# Patient Record
Sex: Female | Born: 1965
Health system: Southern US, Community
[De-identification: ages and names within clinical notes are randomized; demographics above are authoritative.]

## PROBLEM LIST (undated history)

## (undated) DIAGNOSIS — E559 Vitamin D deficiency, unspecified: Secondary | ICD-10-CM

## (undated) DIAGNOSIS — K449 Diaphragmatic hernia without obstruction or gangrene: Secondary | ICD-10-CM

## (undated) DIAGNOSIS — D649 Anemia, unspecified: Secondary | ICD-10-CM

## (undated) DIAGNOSIS — I10 Essential (primary) hypertension: Secondary | ICD-10-CM

## (undated) DIAGNOSIS — T7840XA Allergy, unspecified, initial encounter: Secondary | ICD-10-CM

## (undated) DIAGNOSIS — M25569 Pain in unspecified knee: Secondary | ICD-10-CM

## (undated) DIAGNOSIS — K219 Gastro-esophageal reflux disease without esophagitis: Secondary | ICD-10-CM

## (undated) DIAGNOSIS — K224 Dyskinesia of esophagus: Secondary | ICD-10-CM

## (undated) HISTORY — DX: Dyskinesia of esophagus: K22.4

## (undated) HISTORY — DX: Anemia, unspecified: D64.9

## (undated) HISTORY — DX: Vitamin D deficiency, unspecified: E55.9

## (undated) HISTORY — DX: Gastro-esophageal reflux disease without esophagitis: K21.9

## (undated) HISTORY — DX: Essential (primary) hypertension: I10

## (undated) HISTORY — DX: Allergy, unspecified, initial encounter: T78.40XA

## (undated) HISTORY — PX: LAPAROSCOPIC GASTRECTOMY: SHX5894

## (undated) HISTORY — DX: Diaphragmatic hernia without obstruction or gangrene: K44.9

## (undated) HISTORY — PX: CHOLECYSTECTOMY: SHX55

---

## 1999-03-08 ENCOUNTER — Ambulatory Visit (HOSPITAL_COMMUNITY): Admission: RE | Admit: 1999-03-08 | Discharge: 1999-03-08 | Payer: Self-pay | Admitting: Internal Medicine

## 1999-03-08 ENCOUNTER — Encounter: Payer: Self-pay | Admitting: Internal Medicine

## 2008-07-07 ENCOUNTER — Emergency Department (HOSPITAL_COMMUNITY): Admission: EM | Admit: 2008-07-07 | Discharge: 2008-07-07 | Payer: Self-pay | Admitting: Family Medicine

## 2008-07-16 ENCOUNTER — Encounter: Admission: RE | Admit: 2008-07-16 | Discharge: 2008-08-31 | Payer: Self-pay | Admitting: Orthopedic Surgery

## 2009-09-01 ENCOUNTER — Ambulatory Visit: Payer: Self-pay | Admitting: Internal Medicine

## 2009-09-01 LAB — CONVERTED CEMR LAB
BUN: 10 mg/dL (ref 6–23)
Basophils Absolute: 0.1 10*3/uL (ref 0.0–0.1)
Basophils Relative: 1 % (ref 0–1)
CO2: 27 meq/L (ref 19–32)
Calcium: 9.1 mg/dL (ref 8.4–10.5)
Chloride: 101 meq/L (ref 96–112)
Creatinine, Ser: 0.59 mg/dL (ref 0.40–1.20)
Eosinophils Absolute: 0.2 10*3/uL (ref 0.0–0.7)
Eosinophils Relative: 4 % (ref 0–5)
Ferritin: 25 ng/mL (ref 10–291)
Glucose, Bld: 86 mg/dL (ref 70–99)
HCT: 42.7 % (ref 36.0–46.0)
Helicobacter Pylori Antibody-IgG: 1 — ABNORMAL HIGH
Hemoglobin: 12.6 g/dL (ref 12.0–15.0)
Iron: 32 ug/dL — ABNORMAL LOW (ref 42–145)
Lymphocytes Relative: 50 % — ABNORMAL HIGH (ref 12–46)
Lymphs Abs: 3.2 10*3/uL (ref 0.7–4.0)
MCHC: 29.5 g/dL — ABNORMAL LOW (ref 30.0–36.0)
MCV: 83.2 fL (ref 78.0–100.0)
Monocytes Absolute: 0.4 10*3/uL (ref 0.1–1.0)
Monocytes Relative: 7 % (ref 3–12)
Neutro Abs: 2.5 10*3/uL (ref 1.7–7.7)
Neutrophils Relative %: 39 % — ABNORMAL LOW (ref 43–77)
Platelets: 361 10*3/uL (ref 150–400)
Potassium: 4.3 meq/L (ref 3.5–5.3)
RBC: 5.13 M/uL — ABNORMAL HIGH (ref 3.87–5.11)
RDW: 15.7 % — ABNORMAL HIGH (ref 11.5–15.5)
Saturation Ratios: 8 % — ABNORMAL LOW (ref 20–55)
Sodium: 137 meq/L (ref 135–145)
TIBC: 399 ug/dL (ref 250–470)
TSH: 2.185 microintl units/mL (ref 0.350–4.500)
UIBC: 367 ug/dL
WBC: 6.4 10*3/uL (ref 4.0–10.5)

## 2009-09-06 ENCOUNTER — Ambulatory Visit (HOSPITAL_COMMUNITY): Admission: RE | Admit: 2009-09-06 | Discharge: 2009-09-06 | Payer: Self-pay | Admitting: Family Medicine

## 2010-09-22 ENCOUNTER — Ambulatory Visit (INDEPENDENT_AMBULATORY_CARE_PROVIDER_SITE_OTHER): Payer: BC Managed Care – PPO | Admitting: Internal Medicine

## 2010-09-22 ENCOUNTER — Encounter: Payer: Self-pay | Admitting: Internal Medicine

## 2010-09-22 ENCOUNTER — Other Ambulatory Visit (INDEPENDENT_AMBULATORY_CARE_PROVIDER_SITE_OTHER): Payer: BC Managed Care – PPO

## 2010-09-22 VITALS — BP 138/82 | HR 93 | Temp 98.5°F | Resp 16 | Ht 67.0 in | Wt 304.0 lb

## 2010-09-22 DIAGNOSIS — K219 Gastro-esophageal reflux disease without esophagitis: Secondary | ICD-10-CM | POA: Insufficient documentation

## 2010-09-22 DIAGNOSIS — Z1231 Encounter for screening mammogram for malignant neoplasm of breast: Secondary | ICD-10-CM | POA: Insufficient documentation

## 2010-09-22 DIAGNOSIS — Z0000 Encounter for general adult medical examination without abnormal findings: Secondary | ICD-10-CM

## 2010-09-22 DIAGNOSIS — D649 Anemia, unspecified: Secondary | ICD-10-CM

## 2010-09-22 DIAGNOSIS — I10 Essential (primary) hypertension: Secondary | ICD-10-CM | POA: Insufficient documentation

## 2010-09-22 DIAGNOSIS — L819 Disorder of pigmentation, unspecified: Secondary | ICD-10-CM

## 2010-09-22 DIAGNOSIS — L810 Postinflammatory hyperpigmentation: Secondary | ICD-10-CM | POA: Insufficient documentation

## 2010-09-22 LAB — TSH: TSH: 1.32 u[IU]/mL (ref 0.35–5.50)

## 2010-09-22 LAB — FOLATE: Folate: 24.8 ng/mL (ref >5.9)

## 2010-09-22 LAB — LIPID PANEL
HDL: 42.4 mg/dL (ref >39.00)
Total CHOL/HDL Ratio: 4
VLDL: 25.2 mg/dL (ref 0.0–40.0)

## 2010-09-22 LAB — COMPREHENSIVE METABOLIC PANEL
Albumin: 3.9 g/dL (ref 3.5–5.2)
BUN: 9 mg/dL (ref 6–23)
Calcium: 9.1 mg/dL (ref 8.4–10.5)
Chloride: 101 mEq/L (ref 96–112)
Glucose, Bld: 83 mg/dL (ref 70–99)
Potassium: 4.1 mEq/L (ref 3.5–5.1)

## 2010-09-22 LAB — CBC WITH DIFFERENTIAL/PLATELET
Eosinophils Relative: 4.5 % (ref 0.0–5.0)
Lymphocytes Relative: 38.1 % (ref 12.0–46.0)
Monocytes Relative: 8.6 % (ref 3.0–12.0)
Neutrophils Relative %: 46.8 % (ref 43.0–77.0)
Platelets: 334 10*3/uL (ref 150.0–400.0)
WBC: 7.4 10*3/uL (ref 4.5–10.5)

## 2010-09-22 MED ORDER — LISINOPRIL 20 MG PO TABS: 20.0000 mg | ORAL_TABLET | Freq: Every day | ORAL | Status: DC

## 2010-09-22 MED ORDER — IRON 66 MG PO TABS: 1.0000 | ORAL_TABLET | Freq: Every day | ORAL | Status: DC

## 2010-09-22 MED ORDER — OMEPRAZOLE 20 MG PO CPDR: 20.0000 mg | DELAYED_RELEASE_CAPSULE | Freq: Every day | ORAL | Status: DC

## 2010-09-22 NOTE — Progress Notes (Signed)
Subjective:    Patient ID: Valerie Sweeney, female    DOB: July 13, 1965, 45 y.o.   MRN: [id number]  Anemia Presents for follow-up visit. There has been no abdominal pain, anorexia, bruising/bleeding easily, confusion, fever, leg swelling, light-headedness, malaise/fatigue, pallor, palpitations, paresthesias, pica or weight loss. Signs of blood loss that are not present include hematemesis, hematochezia, melena, menorrhagia and vaginal bleeding. There are no compliance problems.  Compliance with medications is 76-100%.  Hypertension This is a chronic problem. The current episode started more than 1 year ago. The problem has been gradually improving since onset. The problem is controlled. Pertinent negatives include no anxiety, blurred vision, chest pain, headaches, malaise/fatigue, neck pain, orthopnea, palpitations, peripheral edema, PND, shortness of breath or sweats. There are no associated agents to hypertension. Past treatments include ACE inhibitors. The current treatment provides moderate improvement. Compliance problems include exercise and diet.   Gastrophageal Reflux She complains of heartburn. She reports no abdominal pain, no belching, no chest pain, no choking, no coughing, no dysphagia, no early satiety, no globus sensation, no nausea, no sore throat, no stridor, no tooth decay, no water brash or no wheezing. This is a chronic problem. The current episode started more than 1 year ago. The problem occurs rarely. The problem has been gradually improving. The heartburn duration is less than a minute. The heartburn is located in the substernum. The heartburn is of mild intensity. The heartburn does not wake her from sleep. The heartburn does not limit her activity. The heartburn doesn't change with position. Associated symptoms include anemia. Pertinent negatives include no fatigue, melena, muscle weakness, orthopnea or weight loss. Risk factors include no known risk factors. She has tried a PPI for  the symptoms. The treatment provided significant relief.      Review of Systems  Constitutional: Negative for fever, chills, weight loss, malaise/fatigue, diaphoresis, activity change, appetite change, fatigue and unexpected weight change.  HENT: Negative for sore throat, facial swelling, trouble swallowing, neck pain and voice change.   Eyes: Negative for blurred vision, photophobia, pain, discharge, redness, itching and visual disturbance.  Respiratory: Negative for apnea, cough, choking, chest tightness, shortness of breath, wheezing and stridor.   Cardiovascular: Negative for chest pain, palpitations, orthopnea, leg swelling and PND.  Gastrointestinal: Positive for heartburn. Negative for dysphagia, nausea, vomiting, abdominal pain, diarrhea, constipation, blood in stool, melena, hematochezia, abdominal distention, anal bleeding, anorexia and hematemesis.  Genitourinary: Negative for dysuria, urgency, frequency, hematuria, flank pain, decreased urine volume, vaginal bleeding, vaginal discharge, enuresis, difficulty urinating, genital sores, vaginal pain, menstrual problem, pelvic pain, dyspareunia and menorrhagia.  Musculoskeletal: Negative for myalgias, back pain, joint swelling, arthralgias, gait problem and muscle weakness.  Skin: Positive for rash (she has had "dark spots" on both ankles for 2 years). Negative for color change, pallor and wound.  Neurological: Negative for dizziness, tremors, seizures, syncope, facial asymmetry, speech difficulty, weakness, light-headedness, numbness, headaches and paresthesias.  Hematological: Negative for adenopathy. Does not bruise/bleed easily.  Psychiatric/Behavioral: Negative.  Negative for confusion.       Objective:   Physical Exam  Vitals reviewed. Constitutional: She is oriented to person, place, and time. She appears well-developed and well-nourished. No distress.  HENT:  Mouth/Throat: Oropharynx is clear and moist. No oropharyngeal  exudate.  Eyes: Conjunctivae and EOM are normal. Pupils are equal, round, and reactive to light. Right eye exhibits no discharge. Left eye exhibits no discharge. No scleral icterus.  Neck: Normal range of motion. Neck supple. No JVD present. No tracheal deviation present.  No thyromegaly present.  Cardiovascular: Normal rate, regular rhythm, normal heart sounds and intact distal pulses.  Exam reveals no gallop and no friction rub.   No murmur heard. Pulmonary/Chest: Effort normal and breath sounds normal. No stridor. No respiratory distress. She has no wheezes. She has no rales. She exhibits no tenderness.  Abdominal: Soft. Bowel sounds are normal. She exhibits no distension and no mass. There is no tenderness. There is no rebound and no guarding.  Musculoskeletal: Normal range of motion. She exhibits no edema and no tenderness.  Lymphadenopathy:    She has no cervical adenopathy.  Neurological: She is alert and oriented to person, place, and time. She has normal reflexes. She displays normal reflexes. No cranial nerve deficit. She exhibits normal muscle tone. Coordination normal.  Skin: Skin is warm and dry. No abrasion, no bruising, no burn, no ecchymosis, no laceration, no lesion, no petechiae, no purpura and no rash noted. Rash is not macular, not papular, not maculopapular, not nodular, not pustular, not vesicular and not urticarial. She is not diaphoretic. No cyanosis or erythema. No pallor. Nails show no clubbing.          She has benign-appearing diffuse symmetrical hyperpigmentation over both ankles and feet with no specific skin lesions          Assessment & Plan:

## 2010-09-22 NOTE — Assessment & Plan Note (Signed)
Exam done, labs ordered, pt ed material was given 

## 2010-09-22 NOTE — Assessment & Plan Note (Signed)
mammo ordered.

## 2010-09-22 NOTE — Patient Instructions (Signed)

## 2010-09-22 NOTE — Assessment & Plan Note (Signed)
I advised her that this is a benign skin condition with no specific treatment but she is very upset about the cosmetic appearance and insists that "something be done about it" so I will refer her to dermatology to see if there is an option for a bleaching agent or some other form of treatment

## 2010-09-22 NOTE — Assessment & Plan Note (Signed)
Continue the ACEI, I will check her renal function and lytes today

## 2010-09-22 NOTE — Assessment & Plan Note (Signed)
She is doing well on omeprazole 

## 2010-09-22 NOTE — Assessment & Plan Note (Signed)
She tells me that this is iron deficiency, I have written an Rx for iron and today will check her CBC and vitamin levels

## 2010-09-27 ENCOUNTER — Telehealth: Payer: Self-pay

## 2010-09-27 NOTE — Telephone Encounter (Signed)
Omeprazole covered 09/06/10-09/27/11. Pharmacy notified via fax

## 2010-11-23 ENCOUNTER — Ambulatory Visit (HOSPITAL_COMMUNITY)
Admission: RE | Admit: 2010-11-23 | Discharge: 2010-11-23 | Disposition: A | Discharge status: Home / Self Care | Payer: BC Managed Care – PPO | Source: Ambulatory Visit | Attending: Internal Medicine | Admitting: Internal Medicine

## 2010-11-23 ENCOUNTER — Encounter: Payer: Self-pay | Admitting: Internal Medicine

## 2010-11-23 ENCOUNTER — Ambulatory Visit (INDEPENDENT_AMBULATORY_CARE_PROVIDER_SITE_OTHER): Payer: BC Managed Care – PPO | Admitting: Internal Medicine

## 2010-11-23 VITALS — BP 124/86 | HR 84 | Temp 98.2°F | Resp 16 | Wt 302.0 lb

## 2010-11-23 DIAGNOSIS — M25561 Pain in right knee: Secondary | ICD-10-CM

## 2010-11-23 DIAGNOSIS — IMO0002 Reserved for concepts with insufficient information to code with codable children: Secondary | ICD-10-CM

## 2010-11-23 DIAGNOSIS — M25569 Pain in unspecified knee: Secondary | ICD-10-CM

## 2010-11-23 DIAGNOSIS — M1710 Unilateral primary osteoarthritis, unspecified knee: Secondary | ICD-10-CM | POA: Insufficient documentation

## 2010-11-23 DIAGNOSIS — M179 Osteoarthritis of knee, unspecified: Secondary | ICD-10-CM

## 2010-11-23 DIAGNOSIS — M25562 Pain in left knee: Secondary | ICD-10-CM | POA: Insufficient documentation

## 2010-11-23 NOTE — Assessment & Plan Note (Signed)
I will check plain films today to document the presence and severity on the DJD in both knees

## 2010-11-23 NOTE — Assessment & Plan Note (Signed)
I think she should consider having TKR bilateral so I have asked her to see an orthopedist

## 2010-11-23 NOTE — Patient Instructions (Signed)

## 2010-11-23 NOTE — Progress Notes (Signed)
Subjective:    Patient ID: Valerie Sweeney, female    DOB: May 29, 1965, 45 y.o.   MRN: [id number]  Knee Pain  The incident occurred more than 1 week ago. There was no injury mechanism. The pain is present in the left knee and right knee. The quality of the pain is described as aching. The pain is at a severity of 2/10. The pain is mild. The pain has been intermittent since onset. Associated symptoms include muscle weakness (in both legs). Pertinent negatives include no inability to bear weight, loss of motion, loss of sensation, numbness or tingling. She reports no foreign bodies present. The symptoms are aggravated by movement. She has tried acetaminophen for the symptoms. The treatment provided significant relief.      Review of Systems  HENT: Negative.   Eyes: Negative.   Respiratory: Negative.   Cardiovascular: Negative.   Gastrointestinal: Negative.   Genitourinary: Negative.   Musculoskeletal: Positive for arthralgias (both knees). Negative for myalgias, back pain, joint swelling and gait problem.  Skin: Negative for color change, pallor, rash and wound.  Neurological: Positive for weakness. Negative for dizziness, tingling, tremors, seizures, syncope, facial asymmetry, speech difficulty, light-headedness, numbness and headaches.  Hematological: Negative for adenopathy. Does not bruise/bleed easily.  Psychiatric/Behavioral: Negative.        Objective:   Physical Exam  Vitals reviewed. Constitutional: She appears well-developed and well-nourished. No distress.  HENT:  Head: Normocephalic and atraumatic.  Mouth/Throat: Oropharynx is clear and moist. No oropharyngeal exudate.  Eyes: Conjunctivae are normal. Right eye exhibits no discharge. Left eye exhibits no discharge. No scleral icterus.  Neck: Normal range of motion. Neck supple. No JVD present. No tracheal deviation present. No thyromegaly present.  Cardiovascular: Normal rate, regular rhythm, normal heart sounds and intact  distal pulses.  Exam reveals no gallop and no friction rub.   No murmur heard. Pulmonary/Chest: Effort normal and breath sounds normal. No stridor. No respiratory distress. She has no wheezes. She has no rales. She exhibits no tenderness.  Abdominal: Soft. Bowel sounds are normal. She exhibits no distension. There is no tenderness. There is no rebound and no guarding.  Musculoskeletal: Normal range of motion. She exhibits no edema and no tenderness.       She has changes in both knees c/w severe DJD with crepitance but no effusions, warmth, swelling, ttp, laxity. She has very poor muscle tone in both thighs  Lymphadenopathy:    She has no cervical adenopathy.  Neurological: She is alert. She has normal strength. She displays no atrophy, no tremor and normal reflexes. No cranial nerve deficit or sensory deficit. She exhibits normal muscle tone. She displays a negative Romberg sign. She displays no seizure activity. Coordination and gait normal. She displays no Babinski's sign on the right side. She displays no Babinski's sign on the left side.  Reflex Scores:      Tricep reflexes are 0 on the right side and 0 on the left side.      Bicep reflexes are 0 on the right side and 0 on the left side.      Brachioradialis reflexes are 0 on the right side and 0 on the left side.      Patellar reflexes are 0 on the right side and 0 on the left side.      Achilles reflexes are 0 on the right side and 0 on the left side. Skin: Skin is warm and dry. No rash noted. She is not diaphoretic. No erythema. No  pallor.  Psychiatric: She has a normal mood and affect. Her behavior is normal. Judgment and thought content normal.          Assessment & Plan:

## 2010-12-29 ENCOUNTER — Ambulatory Visit (INDEPENDENT_AMBULATORY_CARE_PROVIDER_SITE_OTHER): Payer: BC Managed Care – PPO | Admitting: Surgery

## 2010-12-29 ENCOUNTER — Encounter (INDEPENDENT_AMBULATORY_CARE_PROVIDER_SITE_OTHER): Payer: Self-pay | Admitting: Surgery

## 2010-12-29 NOTE — Progress Notes (Signed)
Re:   Valerie Sweeney DOB:   [birth year] MRN:   [id number]  ASSESSMENT AND PLAN: 1.  Morbid obesity.  Per the 1991 NIH Consensus Statement, the patient is a candidate for bariatric surgery.  The patient attended our information session and reviewed the different types of bariatric surgery.    The patient is interested in the laparoscopic adjustable gastric band (though she asked questions about all the procedures).  I discussed with the patient the indications and risks of lap band surgery.  The potential risks of surgery include, but are not limited to, bleeding, infection, DVT and PE, slippage and erosion of the band, open surgery, and death.  The patient understands the importance of compliance and long term follow-up with our group after surgery.  I spent 30+ minutes going over lap band, RYGB, and sleeve, including the pros and cons.  They did express a possibility of returning to the [place] in the future.  We talked about long term follow up and problems in [place] with surgical care.  She was given literature regarding lap band surgery and encouraged to visit their web site, [email or web address], and register.  From here we'll obtain labs (recent labs done by Dr. Yetta Barre), x-rays, nutrition consult, and psych consult.  2.  Hypertension 3.  GERD. 4.  DJD of knees. 5.  History of anemia.  REFERRING PHYSICIAN: Sanda Linger, MD, MD  HISTORY OF PRESENT ILLNESS: Valerie Sweeney is a 45 y.o. (DOB: 1965-05-17)  [place] female whose primary care physician is Sanda Linger, MD, MD and comes to me today for consideration of bariatric surgery.  She has been overweight much of her adult life.  She has tried "over a million" diets.  Most specifically, she tried a diet from her home country ([place]), and she lost a lot of weight.  She has tried low calories.  She tried some "pills" in 1999 from a local physician, but this did not work well.  She had a lap chole  In March 1996.  She has GERD.  Was endoscoped  last year, but does not remember physician's name.    No history of liver disease.  No history of pancreas disease.  No history of colon disease.    Past Medical History  Diagnosis Date  . Hypertension   . GERD (gastroesophageal reflux disease)   . Allergy   . Anemia       Past Surgical History  Procedure Date  . Cholecystectomy       Current Outpatient Prescriptions  Medication Sig Dispense Refill  . Iron 66 MG TABS Take 1 tablet (66 mg total) by mouth daily.  30 tablet  11  . levonorgestrel (MIRENA) 20 MCG/24HR IUD 1 each by Intrauterine route once.  1 each  0  . lisinopril (PRINIVIL,ZESTRIL) 20 MG tablet Take 1 tablet (20 mg total) by mouth daily.  30 tablet  11  . omeprazole (PRILOSEC) 20 MG capsule Take 1 capsule (20 mg total) by mouth daily.  30 capsule  11     No Known Allergies  REVIEW OF SYSTEMS: Skin:  No history of rash.  No history of abnormal moles. Infection:  No history of hepatitis or HIV.  No history of MRSA. Neurologic:  No history of stroke.  No history of seizure.  No history of headaches. Cardiac:  Hypertension x 5 years.. No history of heart disease.  No history of prior cardiac catheterization.  No history of seeing a cardiologist. Pulmonary:  Does not smoke cigarettes.  No asthma or bronchitis.  No OSA/CPAP.  Endocrine:  No diabetes. No thyroid disease. Gastrointestinal:  See HPI. Urologic:  No history of kidney stones.  No history of bladder infections. GYN:  Has IUD. Musculoskeletal:  She's see an orthopedist, but can not remember his name.  Dr. Yetta Barre sent her there for consideration of knee replacement.  Her weight became the focus of the visit. Hematologic:  No bleeding disorder.  No history of anemia.  Not anticoagulated. Psycho-social:  The patient is oriented.   The patient has no obvious psychologic or social impairment to understanding our conversation and plan.  SOCIAL and FAMILY HISTORY: Accompanied by her husband. Works at [employer].  Working for PhD in Sales promotion account executive. Has 3 children - twin girls 43 and boy 46. She has lived in this country from about 1990.  Her family went back to the [place] in around 2000, but war broke out and they returned to the [place] in one year.  PHYSICAL EXAM: BP 136/90  Pulse 84  Temp 98.5 F (36.9 C)  Resp 16  Ht 5\' 6"  (1.676 m)  Wt 303 lb (137.44 kg)  BMI 48.91 kg/m2  General: WN F who is alert and generally healthy appearing.  HEENT: Normal. Pupils equal. Good dentition. Neck: Supple. No mass.  No thyroid mass.  Carotid pulse okay with no bruit. Lymph Nodes:  No supraclavicular or cervical nodes. Lungs: Clear to auscultation and symmetric breath sounds. Heart:  RRR. No murmur or rub. Breast:  Pendulous, no mass. Abdomen: Soft. No mass. No tenderness. No hernia. Normal bowel sounds.  No abdominal scars.  More pear shape than apple. Rectal: Not done. Extremities:  Good strength and ROM  in upper and lower extremities.  Varicose veins of both legs. Some pigmentation of both ankles. Neurologic:  Grossly intact to motor and sensory function. Psychiatric: Has normal mood and affect. Behavior is normal.   DATA REVIEWED: Notes  Ovidio Kin, MD,  [facility], [place] [address]  [address]   [place], [place]    [address] Phone:  [phone] FAX:  [phone]

## 2010-12-30 ENCOUNTER — Encounter: Payer: Self-pay | Admitting: Internal Medicine

## 2011-01-06 ENCOUNTER — Ambulatory Visit: Payer: BC Managed Care – PPO | Admitting: Internal Medicine

## 2011-01-24 ENCOUNTER — Ambulatory Visit (HOSPITAL_COMMUNITY)
Admission: RE | Admit: 2011-01-24 | Discharge: 2011-01-24 | Disposition: A | Discharge status: Home / Self Care | Payer: BC Managed Care – PPO | Source: Ambulatory Visit | Attending: Surgery | Admitting: Surgery

## 2011-01-24 ENCOUNTER — Encounter: Payer: Self-pay | Admitting: *Deleted

## 2011-01-24 ENCOUNTER — Encounter (INDEPENDENT_AMBULATORY_CARE_PROVIDER_SITE_OTHER): Payer: Self-pay | Admitting: General Surgery

## 2011-01-24 ENCOUNTER — Ambulatory Visit (INDEPENDENT_AMBULATORY_CARE_PROVIDER_SITE_OTHER): Payer: BC Managed Care – PPO | Admitting: General Surgery

## 2011-01-24 ENCOUNTER — Encounter: Payer: BC Managed Care – PPO | Attending: Surgery | Admitting: *Deleted

## 2011-01-24 ENCOUNTER — Encounter (HOSPITAL_COMMUNITY)
Admission: RE | Disposition: A | Discharge status: Home / Self Care | Payer: Self-pay | Source: Ambulatory Visit | Attending: Surgery

## 2011-01-24 VITALS — BP 122/80 | HR 68 | Temp 97.5°F | Resp 18 | Ht 66.0 in | Wt 297.6 lb

## 2011-01-24 DIAGNOSIS — E669 Obesity, unspecified: Secondary | ICD-10-CM

## 2011-01-24 DIAGNOSIS — K449 Diaphragmatic hernia without obstruction or gangrene: Secondary | ICD-10-CM | POA: Insufficient documentation

## 2011-01-24 DIAGNOSIS — Z01818 Encounter for other preprocedural examination: Secondary | ICD-10-CM | POA: Insufficient documentation

## 2011-01-24 DIAGNOSIS — K224 Dyskinesia of esophagus: Secondary | ICD-10-CM | POA: Insufficient documentation

## 2011-01-24 DIAGNOSIS — Z713 Dietary counseling and surveillance: Secondary | ICD-10-CM | POA: Insufficient documentation

## 2011-01-24 HISTORY — PX: BREATH TEK H PYLORI: SHX5422

## 2011-01-24 SURGERY — BREATH TEST, FOR HELICOBACTER PYLORI

## 2011-01-24 NOTE — Progress Notes (Signed)
Patient ID: [name], female   DOB: [birth year], 46 y.o.   MRN: [id number]  Chief Complaint  Patient presents with  . Other    Discuss gastric sleeve    HPI Valerie Sweeney is a 46 y.o. female. This patient presents for discussion of weight loss surgery. She has a tender information session in his artery gone through a good portion of the bariatric workup and she is argument with my partner to discuss surgery however she is interested in the gastric sleeve. It sounds as though she has done significant at research and has settled at the conclusion. She has trouble with her weight to about 16 years since moving to the [place] from [place] and after giving birth to twins. She has not done much her exercise until about 2 weeks ago she began attending the gym she's been going to gym everyday doing elliptical and the treadmill and has lost about 8 pounds in 2 weeks. She has done several home diets without much success. She does have a history of reflux and takes omeprazole for this however she states that when she goes off the omeprazole she does not have severe symptoms and will go several days without any discomfort.  HPI  Past Medical History  Diagnosis Date  . Hypertension   . GERD (gastroesophageal reflux disease)   . Allergy   . Anemia     Past Surgical History  Procedure Date  . Cholecystectomy     Family History  Problem Relation Age of Onset  . Diabetes Mother   . Stroke Father   . Hypertension Father   . Cancer Neg Hx     Social History History  Substance Use Topics  . Smoking status: Never Smoker   . Smokeless tobacco: Never Used  . Alcohol Use: No    No Known Allergies  Current Outpatient Prescriptions  Medication Sig Dispense Refill  . levonorgestrel (MIRENA) 20 MCG/24HR IUD 1 each by Intrauterine route once.  1 each  0  . triamcinolone cream (KENALOG) 0.1 % as needed.      . calcium citrate-vitamin D 200-200 MG-UNIT TABS Take 1 tablet by mouth daily.         . Iron 66 MG TABS Take 1 tablet (66 mg total) by mouth daily.  30 tablet  11  . lisinopril (PRINIVIL,ZESTRIL) 20 MG tablet Take 1 tablet (20 mg total) by mouth daily.  30 tablet  11  . omeprazole (PRILOSEC) 20 MG capsule Take 1 capsule (20 mg total) by mouth daily.  30 capsule  11  . Prenatal Vit-Fe Fumarate-FA (M-VIT PO) Take by mouth.          Review of Systems Review of Systems All other review of systems negative or noncontributory except as stated in the HPI  Blood pressure 122/80, pulse 68, temperature 97.5 F (36.4 C), temperature source Temporal, resp. rate 18, height 5\' 6"  (1.676 m), weight 297 lb 9.6 oz (134.99 kg).  Physical Exam Physical Exam Physical Exam  Vitals reviewed. Constitutional: He is oriented to person, place, and time. He appears well-developed and well-nourished. No distress.  HENT:  Head: Normocephalic and atraumatic.  Mouth/Throat: No oropharyngeal exudate.  Eyes: Conjunctivae and EOM are normal. Pupils are equal, round, and reactive to light. Right eye exhibits no discharge. Left eye exhibits no discharge. No scleral icterus.  Neck: Normal range of motion. No tracheal deviation present.  Cardiovascular: Normal rate, regular rhythm and normal heart sounds.   Pulmonary/Chest: Effort normal  and breath sounds normal. No stridor. No respiratory distress. He has no wheezes. He has no rales. He exhibits no tenderness.  Abdominal: Soft. Bowel sounds are normal. He exhibits no distension and no mass. There is no tenderness. There is no rebound and no guarding.  Musculoskeletal: Normal range of motion. He exhibits no edema and no tenderness.  Neurological: He is alert and oriented to person, place, and time.  Skin: Skin is warm and dry. No rash noted. He is not diaphoretic. No erythema. No pallor.  Psychiatric: He has a normal mood and affect. His behavior is normal. Judgment and thought content normal.    Data Reviewed   Assessment    Morbid obesity with  hypertension and GERD I discussed with her each procedure of the LAP-BAND, the sleeve gastrectomy, and the gastric bypass and she is interested in the sleeve gastrectomy. I think that she be a fine candidate for any procedures however given her reflux I did warn her that her reflux could be exacerbated with a sleeve gastrectomy even to the point beyond repair with medications. I also explained that if her reflux is too severe at the medications did not control it, her surgical options would be limited to gastric bypass. She expressed understanding and in the meantime, is going to stop her PPI and see how that her reflux is. If she has symptoms more than once in the most twice a week, then I really recommended to her that we consider the LAP-BAND for the gastric bypass. Her concerns about the LAP-BAND are very good.  She is concerned that since she travels frequently to [place] where she returns to [place], maintenance of her lab would not be possible.  She is willing to undergo gastric bypass but she is more interested in the sleeve. We discussed the risks of the procedure including stricture, worsening reflux, leaks, need for reoperative surgery and conversion to gastric bypass, vitamin deficiencies and need for protein and vitamin supplementation, and too little or too much weight loss.    Plan    We will continue with her bariatric surgery workup and plan for sleeve gastrectomy if her reflux is only mild.       Lodema Pilot DAVID 01/24/2011, 5:48 PM

## 2011-01-24 NOTE — Progress Notes (Signed)
  Pre-Op Assessment Visit: Pre-Operative LAGB Surgery  Medical Nutrition Therapy:  Appt start time: 1100 end time:  1200.  Patient was seen on 01/24/2011 for Pre-Operative LAGB Nutrition Assessment. Assessment and letter of approval faxed to [facility] B[facility]ric [facility] Program coordinator on 01/24/2011.  Approval letter sent to [facility] center and will be available in the chart under the media tab.  Handouts given during visit include:  Pre-Op Goals Handout  Patient to call for Pre-Op and Post-Op Nutrition Education at the [facility] when surgery is scheduled.

## 2011-01-24 NOTE — Patient Instructions (Signed)
   Follow Pre-Op Nutrition Goals to prepare for LAGB Surgery.   Call the [facility] at [phone] once you have been given your surgery date to enrolled in the Pre-Op Nutrition Class. You will need to attend this nutrition class 3-4 weeks prior to your surgery. 

## 2011-01-25 ENCOUNTER — Encounter (HOSPITAL_COMMUNITY): Payer: Self-pay

## 2011-01-25 ENCOUNTER — Encounter (HOSPITAL_COMMUNITY): Payer: Self-pay | Admitting: Surgery

## 2011-02-02 ENCOUNTER — Ambulatory Visit (HOSPITAL_COMMUNITY)
Admission: RE | Admit: 2011-02-02 | Discharge: 2011-02-02 | Disposition: A | Discharge status: Home / Self Care | Payer: BC Managed Care – PPO | Source: Ambulatory Visit | Attending: Surgery | Admitting: Surgery

## 2011-02-02 ENCOUNTER — Other Ambulatory Visit (HOSPITAL_COMMUNITY): Payer: BC Managed Care – PPO

## 2011-02-02 DIAGNOSIS — Z1231 Encounter for screening mammogram for malignant neoplasm of breast: Secondary | ICD-10-CM | POA: Insufficient documentation

## 2011-02-02 IMAGING — MG MM DIGITAL SCREENING BILAT
7 series · 7 of 7 positions shown · non-contrast
Comparison: Prior studies.

DG SCREEN MAMMOGRAM BILATERAL
Bilateral CC and MLO view(s) were taken.
Technologist: JACOBIE, RT RM

DIGITAL SCREENING MAMMOGRAM WITH CAD:

[R CC]
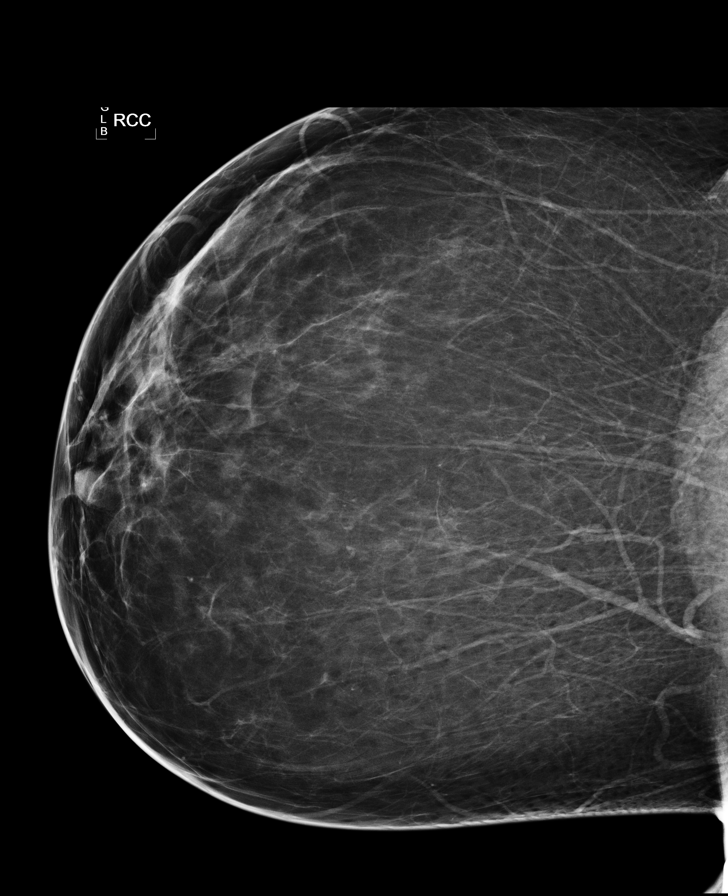

[R MLO (1 of 2)]
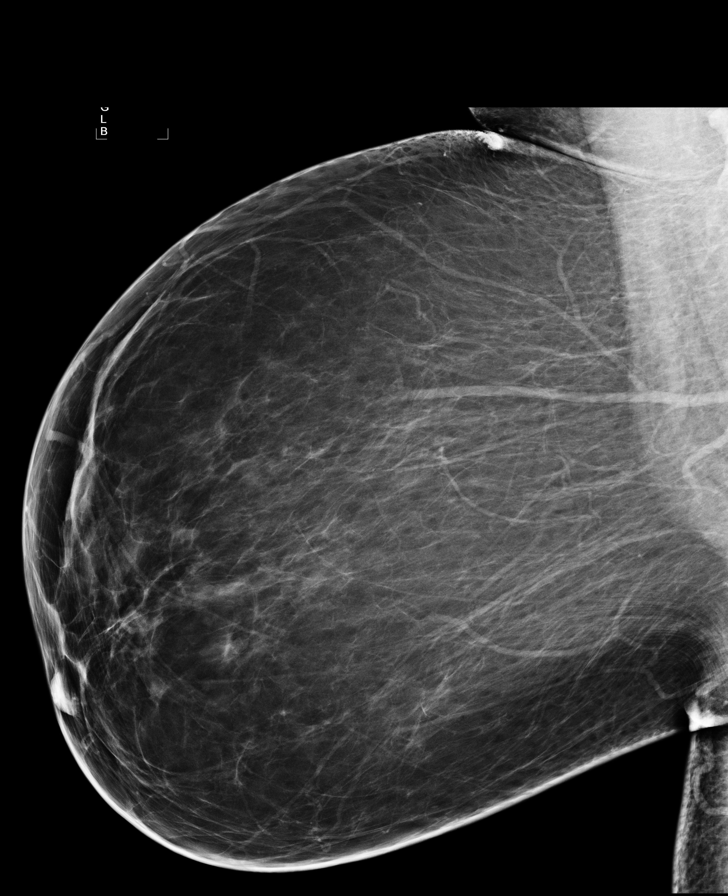

[L CC (1 of 2)]
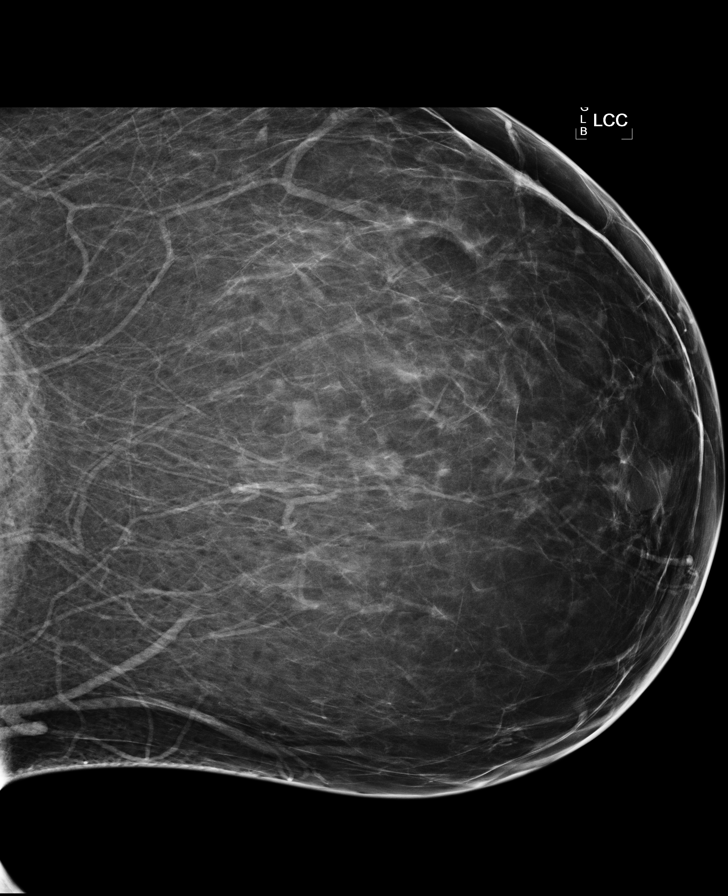

[L MLO (1 of 2)]
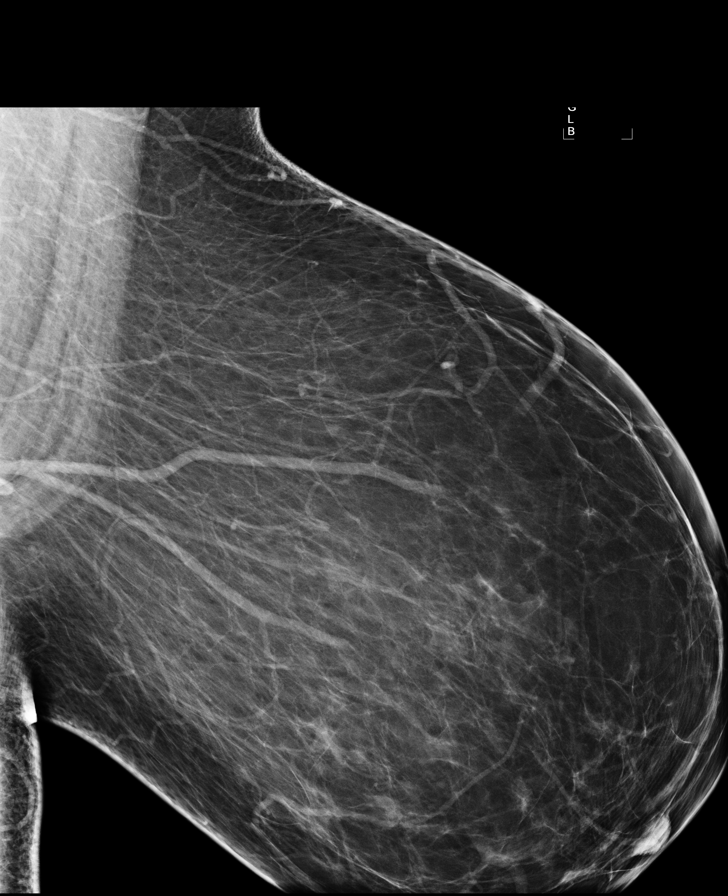

[L CC (2 of 2)]
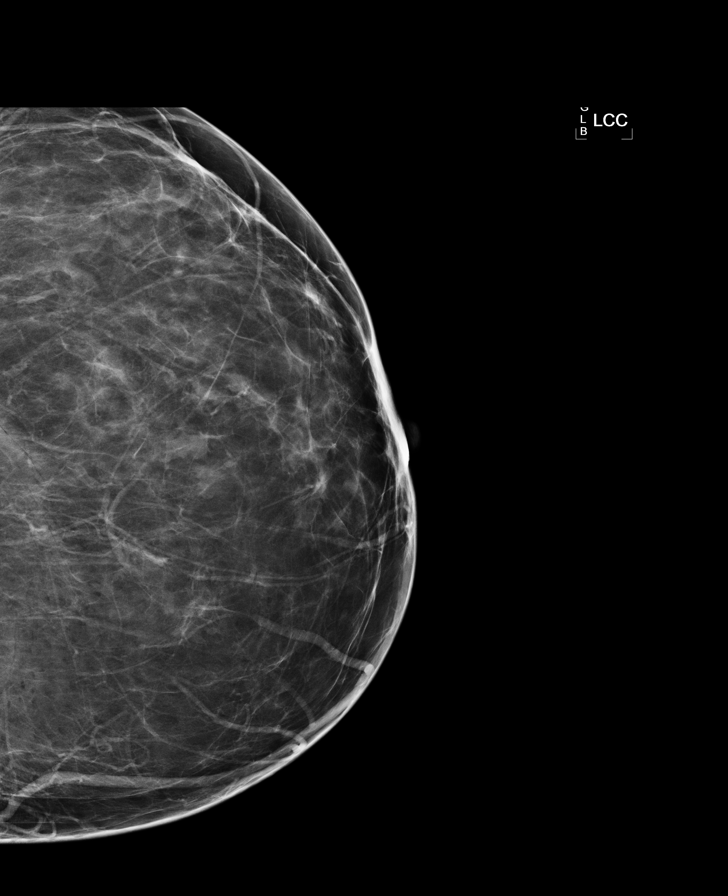

[L MLO (2 of 2)]
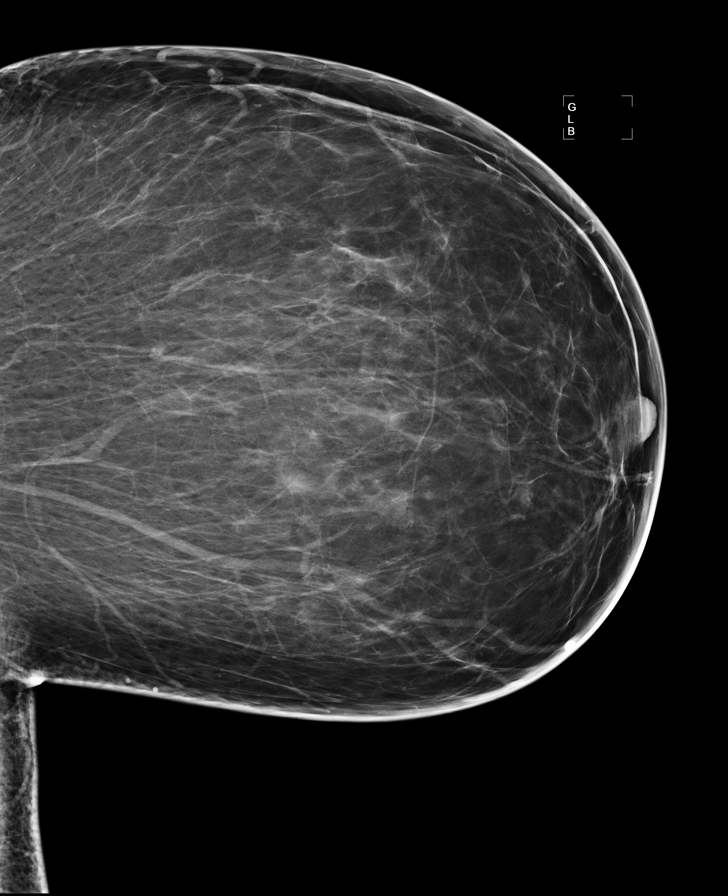

[R MLO (2 of 2)]
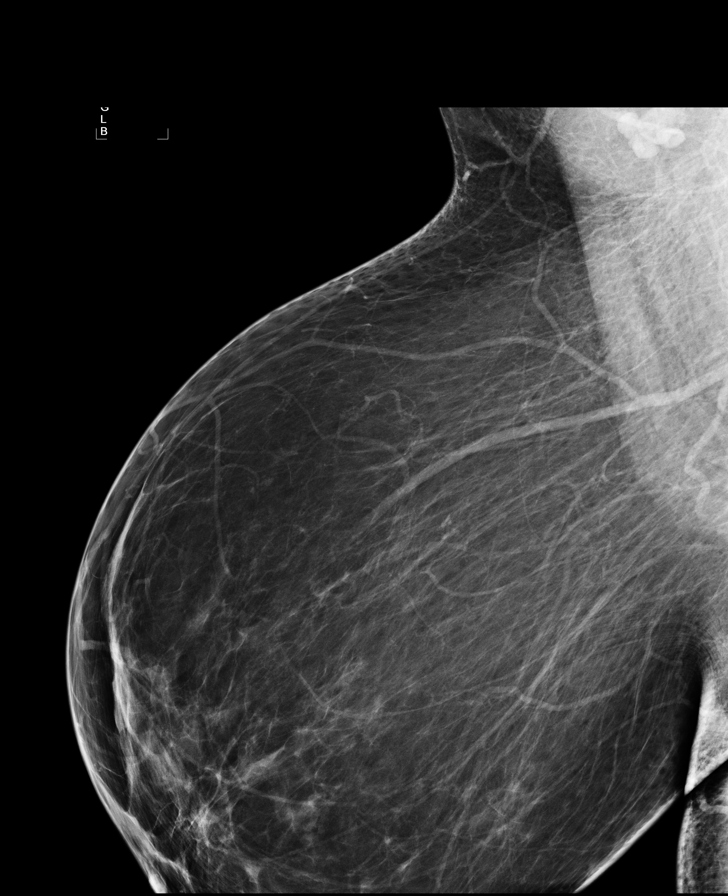

[7 of 7 positions shown; findings below may reference images not displayed]

There are scattered fibroglandular densities.  There is no dominant mass, architectural distortion 
or calcification to suggest malignancy.

Images were processed with CAD.
IMPRESSION: No mammographic evidence of malignancy.  Suggest yearly screening mammography.

A result letter of this screening mammogram will be mailed directly to the patient.

ASSESSMENT: Negative - BI-RADS 1

Screening mammogram in 1 year.
,

## 2011-02-07 ENCOUNTER — Telehealth (INDEPENDENT_AMBULATORY_CARE_PROVIDER_SITE_OTHER): Payer: Self-pay | Admitting: General Surgery

## 2011-02-07 NOTE — Telephone Encounter (Signed)
Received the following email from the patient and advised her I will forward to Dr. Biagio Quint:  From: [name] [[email or web address]] Sent: Monday, February 06, 2011 3:17 PM To: Leandrew Koyanagi Subject: RE: Appointment List Valerie Sweeney,  Please let Dr. Biagio Quint know that I stopped my Omprazole medicine since Tuesday 1/15 and have no issues. I only used Tums twice after eating hot sauce.  He asked me to let him know about this. Many thanks.  Valerie Sweeney

## 2011-02-10 ENCOUNTER — Other Ambulatory Visit (INDEPENDENT_AMBULATORY_CARE_PROVIDER_SITE_OTHER): Payer: Self-pay | Admitting: General Surgery

## 2011-02-10 DIAGNOSIS — E669 Obesity, unspecified: Secondary | ICD-10-CM

## 2011-03-02 ENCOUNTER — Encounter: Payer: BC Managed Care – PPO | Attending: Surgery | Admitting: *Deleted

## 2011-03-02 DIAGNOSIS — Z713 Dietary counseling and surveillance: Secondary | ICD-10-CM | POA: Insufficient documentation

## 2011-03-02 DIAGNOSIS — Z01818 Encounter for other preprocedural examination: Secondary | ICD-10-CM | POA: Insufficient documentation

## 2011-03-02 NOTE — Progress Notes (Signed)
  Bariatric Class:  Appt start time: 0900 end time:  1000.  Pre-Operative Nutrition Class  Patient was seen on 03/02/2011 for Pre-Operative Bariatric Surgery Education at the [facility].  Surgery date: 03/21/11 Surgery type: Gastric Sleeve  Last Weight: 295.4 lbs  Samples given per MNT protocol: Bariatric Advantage Multivitamin Lot #  [id number] Exp: 09/13  Bariatric Advantage Calcium Citrate Lot #  [id number] Exp:  12/13  Celebrate Vitamins Multivitamin Lot # [id number] Exp: 06/14  Celebrate Vitamins Calcium Citrate Lot # [id number] Exp:  11/13  The following the learning objective met by the patient during this course:   Identifies Pre-Op Dietary Goals and will begin 2 weeks pre-operatively   Identifies appropriate sources of fluids and proteins   States protein recommendations and appropriate sources pre and post-operatively  Identifies Post-Operative Dietary Goals and will follow for 2 weeks post-operatively  Identifies appropriate multivitamin and calcium sources  Describes the need for physical activity post-operatively and will follow MD recommendations  States when to call healthcare provider regarding medication questions or post-operative complications  Handouts given during class include:  Pre-Op Bariatric Surgery Diet Handout  Protein Shake Handout  Post-Op Bariatric Surgery Nutrition Handout  BELT Program Information Flyer  Support Group Information Flyer  Follow-Up Plan: Patient will follow-up at [facility] 2 weeks post operatively for diet advancement per MD.

## 2011-03-02 NOTE — Patient Instructions (Signed)
Follow:   Pre-Op Diet per MD 2 weeks prior to surgery  Phase 2- Liquids (clear/full) 2 weeks after surgery  Vitamin/Mineral/Calcium guidelines for purchasing bariatric supplements  Exercise guidelines pre and post-op per MD  Follow-up at [facility] in 2 weeks post-op for diet advancement. Contact Emersynn Deatley as needed with questions/concerns. 

## 2011-03-02 NOTE — Pre-Procedure Instructions (Signed)
SPOKE WITH PT BY PHONE TO ARRANGE HER PREOP APPT AT [facility]--SHE IS FROM [place]--SPEAKS ENGLISH AND TEACHES INTERPRETERS AT [employer]--DOES NOT NEED INTERPRETER FOR HER PREOP APPT.

## 2011-03-03 ENCOUNTER — Encounter: Payer: Self-pay | Admitting: *Deleted

## 2011-03-10 ENCOUNTER — Ambulatory Visit (INDEPENDENT_AMBULATORY_CARE_PROVIDER_SITE_OTHER): Payer: BC Managed Care – PPO | Admitting: General Surgery

## 2011-03-13 ENCOUNTER — Encounter (HOSPITAL_COMMUNITY): Payer: Self-pay | Admitting: Pharmacy Technician

## 2011-03-14 ENCOUNTER — Ambulatory Visit (INDEPENDENT_AMBULATORY_CARE_PROVIDER_SITE_OTHER): Payer: BC Managed Care – PPO | Admitting: General Surgery

## 2011-03-14 ENCOUNTER — Encounter (INDEPENDENT_AMBULATORY_CARE_PROVIDER_SITE_OTHER): Payer: Self-pay | Admitting: General Surgery

## 2011-03-14 NOTE — Progress Notes (Signed)
Patient ID: Valerie Sweeney, female   DOB: [birth year], 46 y.o.   MRN: [id number]  Chief Complaint  Patient presents with  . Bariatric Pre-op    gastric sleeve    HPI Valerie Sweeney is a 46 y.o. female. This patient presents today for preoperative visit for laparoscopic vertical sleeve gastrectomy for obesity. She has a BMI of 47 with hypertension, arthritis, and GERD. She has completed her psychology and nutrition evaluations and laboratory studies and has been cleared for surgery. She had an upper GI which demonstrated a small hiatal hernia.  She has lost 10lbs on her preop diet. HPI  Past Medical History  Diagnosis Date  . Hypertension   . GERD (gastroesophageal reflux disease)   . Allergy   . Anemia     Past Surgical History  Procedure Date  . Cholecystectomy   . Breath tek h pylori 01/24/2011    Procedure: BREATH TEK H PYLORI;  Surgeon: Kandis Cocking, MD;  Location: [facility];  Service: General;  Laterality: N/A;    Family History  Problem Relation Age of Onset  . Diabetes Mother   . Stroke Father   . Hypertension Father   . Cancer Neg Hx     Social History History  Substance Use Topics  . Smoking status: Never Smoker   . Smokeless tobacco: Never Used  . Alcohol Use: No    Allergies  Allergen Reactions  . Pork-Derived Products Other (See Comments)    Belief     Current Outpatient Prescriptions  Medication Sig Dispense Refill  . calcium citrate-vitamin D 200-200 MG-UNIT TABS Take 1 tablet by mouth daily.        . Iron 66 MG TABS Take 1 tablet (66 mg total) by mouth daily.  30 tablet  11  . lisinopril (PRINIVIL,ZESTRIL) 20 MG tablet Take 20 mg by mouth at bedtime.      . Multiple Vitamin (MULITIVITAMIN WITH MINERALS) TABS Take 1 tablet by mouth daily.      Marland Kitchen triamcinolone cream (KENALOG) 0.1 % Apply 1 application topically as needed. Itching       . levonorgestrel (MIRENA) 20 MCG/24HR IUD 1 each by Intrauterine route once.  1 each  0    Review of  Systems Review of Systems All other review of systems negative or noncontributory except as stated in the HPI  Blood pressure 132/90, pulse 84, temperature 97.9 F (36.6 C), temperature source Temporal, resp. rate 18, height 5\' 6"  (1.676 m), weight 291 lb 12.8 oz (132.36 kg).  Physical Exam Physical Exam Physical Exam  Nursing note and vitals reviewed. Constitutional: She is oriented to person, place, and time. She appears well-developed and well-nourished. No distress.  HENT:  Head: Normocephalic and atraumatic.  Mouth/Throat: No oropharyngeal exudate.  Eyes: Conjunctivae and EOM are normal. Pupils are equal, round, and reactive to light. Right eye exhibits no discharge. Left eye exhibits no discharge. No scleral icterus.  Neck: Normal range of motion. Neck supple. No tracheal deviation present.  Cardiovascular: Normal rate, regular rhythm, normal heart sounds and intact distal pulses.   Pulmonary/Chest: Effort normal and breath sounds normal. No stridor. No respiratory distress. She has no wheezes.  Abdominal: Soft. Bowel sounds are normal. She exhibits no distension and no mass. There is no tenderness. There is no rebound and no guarding.  Musculoskeletal: Normal range of motion. She exhibits no edema and no tenderness.  Neurological: She is alert and oriented to person, place, and time.  Skin: Skin is warm  and dry. No rash noted. She is not diaphoretic. No erythema. No pallor.  Psychiatric: She has a normal mood and affect. Her behavior is normal. Judgment and thought content normal.   Data Reviewed   Assessment    Morbid obesity Think that she is a good candidate for sleeve gastrectomy. I did discuss with her the procedure as well as the potential risks. We discussed the risks of infection, bleeding, pain, scarring, too much or too little weight loss, staple line leak and need for reoperation. We also discussed the risks of stricture and worsening reflux and the possibility of the  need to convert this to rule out gastric bypass she expressed understanding and desires to proceed with vertical sleeve gastrectomy.    Plan    We will plan for vertical sleeve gastrectomy next week.       Lodema Pilot DAVID 03/14/2011, 3:49 PM

## 2011-03-15 ENCOUNTER — Encounter (HOSPITAL_COMMUNITY)
Admission: RE | Admit: 2011-03-15 | Discharge: 2011-03-15 | Disposition: A | Discharge status: Home / Self Care | Payer: BC Managed Care – PPO | Source: Ambulatory Visit | Attending: General Surgery | Admitting: General Surgery

## 2011-03-15 ENCOUNTER — Ambulatory Visit (HOSPITAL_COMMUNITY)
Admission: RE | Admit: 2011-03-15 | Discharge: 2011-03-15 | Disposition: A | Discharge status: Home / Self Care | Payer: BC Managed Care – PPO | Source: Ambulatory Visit | Attending: General Surgery | Admitting: General Surgery

## 2011-03-15 ENCOUNTER — Encounter (HOSPITAL_COMMUNITY): Payer: Self-pay

## 2011-03-15 ENCOUNTER — Other Ambulatory Visit: Payer: Self-pay

## 2011-03-15 DIAGNOSIS — Z01818 Encounter for other preprocedural examination: Secondary | ICD-10-CM | POA: Insufficient documentation

## 2011-03-15 DIAGNOSIS — K219 Gastro-esophageal reflux disease without esophagitis: Secondary | ICD-10-CM | POA: Insufficient documentation

## 2011-03-15 DIAGNOSIS — Z01810 Encounter for preprocedural cardiovascular examination: Secondary | ICD-10-CM | POA: Insufficient documentation

## 2011-03-15 DIAGNOSIS — I10 Essential (primary) hypertension: Secondary | ICD-10-CM | POA: Insufficient documentation

## 2011-03-15 DIAGNOSIS — Z01812 Encounter for preprocedural laboratory examination: Secondary | ICD-10-CM | POA: Insufficient documentation

## 2011-03-15 DIAGNOSIS — E669 Obesity, unspecified: Secondary | ICD-10-CM

## 2011-03-15 HISTORY — DX: Pain in unspecified knee: M25.569

## 2011-03-15 LAB — COMPREHENSIVE METABOLIC PANEL
AST: 19 U/L (ref 0–37)
Albumin: 3.8 g/dL (ref 3.5–5.2)
BUN: 13 mg/dL (ref 6–23)
Calcium: 9.6 mg/dL (ref 8.4–10.5)
Chloride: 98 mEq/L (ref 96–112)
Creatinine, Ser: 0.56 mg/dL (ref 0.50–1.10)
GFR calc non Af Amer: >90 mL/min (ref >90)
Total Bilirubin: 0.2 mg/dL — ABNORMAL LOW (ref 0.3–1.2)

## 2011-03-15 LAB — CBC
HCT: 38.2 % (ref 36.0–46.0)
MCH: 25.7 pg — ABNORMAL LOW (ref 26.0–34.0)
MCV: 78.6 fL (ref 78.0–100.0)
Platelets: 314 10*3/uL (ref 150–400)
RDW: 14.4 % (ref 11.5–15.5)
WBC: 6.9 10*3/uL (ref 4.0–10.5)

## 2011-03-15 LAB — DIFFERENTIAL
Basophils Absolute: 0.1 10*3/uL (ref 0.0–0.1)
Basophils Relative: 1 % (ref 0–1)
Lymphocytes Relative: 39 % (ref 12–46)
Neutro Abs: 3.4 10*3/uL (ref 1.7–7.7)
Neutrophils Relative %: 49 % (ref 43–77)

## 2011-03-15 NOTE — Pre-Procedure Instructions (Signed)
PREOP ANESTHESIOLOGY CONSULT WAS DONE TODAY BY DR. ROSE.  EKG AND CXR WAS DONE TODAY PREOP - AS PER ANESTHESIOLOGIST'S GUIDELINES.  PREOP LABS DRAWN TODAY WERE CBC WITH DIFF AND CMET AND SERUM PREG.

## 2011-03-15 NOTE — Anesthesia Preprocedure Evaluation (Addendum)
Anesthesia Evaluation  Patient identified by MRN, date of birth, ID band Patient awake    Reviewed: Allergy & Precautions, H&P , NPO status , Patient's Chart, lab work & pertinent test results  Airway Mallampati: II TM Distance: <3 FB Neck ROM: Full    Dental No notable dental hx.    Pulmonary neg pulmonary ROS,  breath sounds clear to auscultation  Pulmonary exam normal       Cardiovascular hypertension, Rhythm:Regular Rate:Normal     Neuro/Psych negative neurological ROS  negative psych ROS   GI/Hepatic Neg liver ROS, GERD-  ,  Endo/Other  Morbid obesity  Renal/GU negative Renal ROS  negative genitourinary   Musculoskeletal negative musculoskeletal ROS (+)   Abdominal   Peds negative pediatric ROS (+)  Hematology negative hematology ROS (+)   Anesthesia Other Findings   Reproductive/Obstetrics negative OB ROS                          Anesthesia Physical Anesthesia Plan  ASA: III  Anesthesia Plan: General   Post-op Pain Management:    Induction: Intravenous  Airway Management Planned: Oral ETT  Additional Equipment:   Intra-op Plan:   Post-operative Plan: Extubation in OR  Informed Consent: I have reviewed the patients History and Physical, chart, labs and discussed the procedure including the risks, benefits and alternatives for the proposed anesthesia with the patient or authorized representative who has indicated his/her understanding and acceptance.   Dental advisory given  Plan Discussed with: CRNA  Anesthesia Plan Comments:         Anesthesia Quick Evaluation

## 2011-03-15 NOTE — Patient Instructions (Signed)
YOUR SURGERY IS SCHEDULED ON:  Tuesday 3/12  AT 7:15 AM  REPORT TO [facility] AT:  5:15 AM      PHONE # FOR SHORT STAY IS [phone]  DO NOT EAT OR DRINK ANYTHING AFTER MIDNIGHT THE NIGHT BEFORE YOUR SURGERY.  YOU MAY BRUSH YOUR TEETH, RINSE OUT YOUR MOUTH--BUT NO WATER, NO FOOD, NO CHEWING GUM, NO MINTS, NO CANDIES, NO CHEWING TOBACCO.  PLEASE TAKE THE FOLLOWING MEDICATIONS THE AM OF YOUR SURGERY WITH A FEW SIPS OF WATER:  NO MEDICATIONS TO TAKE THE AM OF SURGERY    IF YOU USE INHALERS--USE YOUR INHALERS THE AM OF YOUR SURGERY AND BRING INHALERS TO THE HOSPITAL -TAKE TO SURGERY.    IF YOU ARE DIABETIC:  DO NOT TAKE ANY DIABETIC MEDICATIONS THE AM OF YOUR SURGERY.  IF YOU TAKE INSULIN IN THE EVENINGS--PLEASE ONLY TAKE 1/2 NORMAL EVENING DOSE THE NIGHT BEFORE YOUR SURGERY.  NO INSULIN THE AM OF YOUR SURGERY.  IF YOU HAVE SLEEP APNEA AND USE CPAP OR BIPAP--PLEASE BRING THE MASK --NOT THE MACHINE-NOT THE TUBING   -JUST THE MASK. DO NOT BRING VALUABLES, MONEY, CREDIT CARDS.  CONTACT LENS, DENTURES / PARTIALS, GLASSES SHOULD NOT BE WORN TO SURGERY AND IN MOST CASES-HEARING AIDS WILL NEED TO BE REMOVED.  BRING YOUR GLASSES CASE, ANY EQUIPMENT NEEDED FOR YOUR CONTACT LENS. FOR PATIENTS ADMITTED TO THE HOSPITAL--CHECK OUT TIME THE DAY OF DISCHARGE IS 11:00 AM.  ALL INPATIENT ROOMS ARE PRIVATE - WITH BATHROOM, TELEPHONE, TELEVISION AND WIFI INTERNET. IF YOU ARE BEING DISCHARGED THE SAME DAY OF YOUR SURGERY--YOU CAN NOT DRIVE YOURSELF HOME--AND SHOULD NOT GO HOME ALONE BY TAXI OR BUS.  NO DRIVING OR OPERATING MACHINERY FOR 24 HOURS FOLLOWING ANESTHESIA / PAIN MEDICATIONS.                            SPECIAL INSTRUCTIONS:  CHLORHEXIDINE SOAP SHOWER (other brand names are Betasept and Hibiclens ) PLEASE SHOWER WITH CHLORHEXIDINE THE NIGHT BEFORE YOUR SURGERY AND THE AM OF YOUR SURGERY. DO NOT USE CHLORHEXIDINE ON YOUR FACE OR PRIVATE AREAS--YOU MAY USE YOUR NORMAL SOAP THOSE AREAS AND  YOUR NORMAL SHAMPOO.  WOMEN SHOULD AVOID SHAVING UNDER ARMS AND SHAVING LEGS 48 HOURS BEFORE USING CHLORHEXIDINE TO AVOID SKIN IRRITATION.  DO NOT USE IF ALLERGIC TO CHLORHEXIDINE.  PLEASE READ OVER ANY  FACT SHEETS THAT YOU WERE GIVEN: MRSA INFORMATION, BLOOD TRANSFUSION INFORMATION, INCENTIVE SPIROMETER INFORMATION.

## 2011-03-17 ENCOUNTER — Ambulatory Visit (INDEPENDENT_AMBULATORY_CARE_PROVIDER_SITE_OTHER): Payer: BC Managed Care – PPO | Admitting: Internal Medicine

## 2011-03-17 ENCOUNTER — Encounter: Payer: Self-pay | Admitting: Internal Medicine

## 2011-03-17 VITALS — BP 130/82 | HR 88 | Temp 98.2°F | Resp 16 | Wt 291.0 lb

## 2011-03-17 DIAGNOSIS — I10 Essential (primary) hypertension: Secondary | ICD-10-CM

## 2011-03-17 DIAGNOSIS — Z23 Encounter for immunization: Secondary | ICD-10-CM

## 2011-03-17 DIAGNOSIS — D649 Anemia, unspecified: Secondary | ICD-10-CM

## 2011-03-17 NOTE — Patient Instructions (Signed)

## 2011-03-17 NOTE — Progress Notes (Signed)
  Subjective:    Patient ID: Valerie Sweeney, female    DOB: 1965-07-18, 46 y.o.   MRN: [id number]  Anemia Presents for follow-up visit. There has been no abdominal pain, anorexia, bruising/bleeding easily, confusion, fever, leg swelling, light-headedness, malaise/fatigue, pallor, palpitations, paresthesias, pica or weight loss. Signs of blood loss that are not present include hematemesis, hematochezia, melena, menorrhagia and vaginal bleeding. There are no compliance problems.       Review of Systems  Constitutional: Negative for fever, chills, weight loss, malaise/fatigue, diaphoresis, activity change, appetite change, fatigue and unexpected weight change.  HENT: Negative.   Eyes: Negative.   Respiratory: Negative for cough, choking, chest tightness, shortness of breath, wheezing and stridor.   Cardiovascular: Negative for chest pain, palpitations and leg swelling.  Gastrointestinal: Negative for nausea, vomiting, abdominal pain, diarrhea, constipation, blood in stool, melena, hematochezia, abdominal distention, anal bleeding, anorexia and hematemesis.  Genitourinary: Negative.  Negative for vaginal bleeding and menorrhagia.  Musculoskeletal: Negative for myalgias, back pain, joint swelling, arthralgias and gait problem.  Skin: Negative for color change, pallor, rash and wound.  Neurological: Negative for dizziness, tremors, seizures, syncope, facial asymmetry, speech difficulty, weakness, light-headedness, numbness, headaches and paresthesias.  Hematological: Negative for adenopathy. Does not bruise/bleed easily.  Psychiatric/Behavioral: Negative.  Negative for confusion.       Objective:   Physical Exam  Vitals reviewed. Constitutional: She is oriented to person, place, and time. She appears well-developed and well-nourished. No distress.  HENT:  Head: Normocephalic and atraumatic.  Mouth/Throat: Oropharynx is clear and moist. No oropharyngeal exudate.  Eyes: Conjunctivae are  normal. Right eye exhibits no discharge. Left eye exhibits no discharge. No scleral icterus.  Neck: Normal range of motion. Neck supple. No JVD present. No tracheal deviation present. No thyromegaly present.  Cardiovascular: Normal rate, regular rhythm, normal heart sounds and intact distal pulses.  Exam reveals no gallop and no friction rub.   No murmur heard. Pulmonary/Chest: Effort normal and breath sounds normal. No stridor. No respiratory distress. She has no wheezes. She has no rales. She exhibits no tenderness.  Abdominal: Soft. Bowel sounds are normal. She exhibits no distension. There is no tenderness. There is no rebound and no guarding.  Musculoskeletal: Normal range of motion. She exhibits no edema and no tenderness.  Lymphadenopathy:    She has no cervical adenopathy.  Neurological: She is oriented to person, place, and time.  Skin: Skin is warm and dry. No rash noted. She is not diaphoretic. No erythema. No pallor.  Psychiatric: She has a normal mood and affect. Her behavior is normal. Judgment and thought content normal.      Lab Results  Component Value Date   WBC 6.9 03/15/2011   HGB 12.5 03/15/2011   HCT 38.2 03/15/2011   PLT 314 03/15/2011   GLUCOSE 80 03/15/2011   CHOL 164 09/22/2010   TRIG 126.0 09/22/2010   HDL 42.40 09/22/2010   LDLCALC 96 09/22/2010   ALT 23 03/15/2011   AST 19 03/15/2011   NA 136 03/15/2011   K 4.1 03/15/2011   CL 98 03/15/2011   CREATININE 0.56 03/15/2011   BUN 13 03/15/2011   CO2 27 03/15/2011   TSH 1.32 09/22/2010      Assessment & Plan:

## 2011-03-20 ENCOUNTER — Encounter: Payer: Self-pay | Admitting: Internal Medicine

## 2011-03-20 NOTE — Assessment & Plan Note (Signed)
This has improved.

## 2011-03-20 NOTE — Assessment & Plan Note (Signed)
Her BP is well controlled 

## 2011-03-21 ENCOUNTER — Encounter (HOSPITAL_COMMUNITY): Payer: Self-pay | Admitting: *Deleted

## 2011-03-21 ENCOUNTER — Ambulatory Visit (HOSPITAL_COMMUNITY): Payer: BC Managed Care – PPO | Admitting: Anesthesiology

## 2011-03-21 ENCOUNTER — Encounter (HOSPITAL_COMMUNITY)
Admission: RE | Disposition: A | Discharge status: Home / Self Care | Payer: Self-pay | Source: Ambulatory Visit | Attending: General Surgery

## 2011-03-21 ENCOUNTER — Inpatient Hospital Stay (HOSPITAL_COMMUNITY)
Admission: RE | Admit: 2011-03-21 | Discharge: 2011-03-23 | DRG: 293 | Disposition: A | Discharge status: Home / Self Care | Payer: BC Managed Care – PPO | Source: Ambulatory Visit | Attending: General Surgery | Admitting: General Surgery

## 2011-03-21 ENCOUNTER — Encounter (HOSPITAL_COMMUNITY): Payer: Self-pay | Admitting: Anesthesiology

## 2011-03-21 DIAGNOSIS — I10 Essential (primary) hypertension: Secondary | ICD-10-CM

## 2011-03-21 DIAGNOSIS — K2100 Gastro-esophageal reflux disease with esophagitis, without bleeding: Secondary | ICD-10-CM

## 2011-03-21 DIAGNOSIS — E669 Obesity, unspecified: Secondary | ICD-10-CM

## 2011-03-21 DIAGNOSIS — K219 Gastro-esophageal reflux disease without esophagitis: Secondary | ICD-10-CM | POA: Diagnosis present

## 2011-03-21 DIAGNOSIS — Z6841 Body Mass Index (BMI) 40.0 and over, adult: Secondary | ICD-10-CM

## 2011-03-21 SURGERY — GASTRECTOMY, SLEEVE, LAPAROSCOPIC
Anesthesia: General | Wound class: Clean Contaminated

## 2011-03-21 MED ORDER — STERILE WATER FOR IRRIGATION IR SOLN
Status: DC | PRN
  Administered 2011-03-21: 1500 mL

## 2011-03-21 MED ORDER — LACTATED RINGERS IR SOLN
Status: DC | PRN
  Administered 2011-03-21: 1

## 2011-03-21 MED ORDER — FIBRIN SEALANT COMPONENT 5 ML EX KIT
PACK | CUTANEOUS | Status: AC
  Filled 2011-03-21: qty 2

## 2011-03-21 MED ORDER — ENOXAPARIN SODIUM 40 MG/0.4ML ~~LOC~~ SOLN
40.0000 mg | SUBCUTANEOUS | Status: DC
  Administered 2011-03-22 – 2011-03-23 (×2): 40 mg via SUBCUTANEOUS
  Filled 2011-03-21 (×2): qty 0.4

## 2011-03-21 MED ORDER — BUPIVACAINE HCL 0.25 % IJ SOLN
INTRAMUSCULAR | Status: DC | PRN
  Administered 2011-03-21: 30 mL

## 2011-03-21 MED ORDER — SUCCINYLCHOLINE CHLORIDE 20 MG/ML IJ SOLN
INTRAMUSCULAR | Status: DC | PRN
  Administered 2011-03-21: 100 mg via INTRAVENOUS

## 2011-03-21 MED ORDER — FIBRIN SEALANT COMPONENT 10 ML EX KIT
PACK | CUTANEOUS | Status: DC | PRN
  Administered 2011-03-21: 10 mL

## 2011-03-21 MED ORDER — CISATRACURIUM BESYLATE 2 MG/ML IV SOLN
INTRAVENOUS | Status: DC | PRN
  Administered 2011-03-21: 4 mg via INTRAVENOUS
  Administered 2011-03-21: 2 mg via INTRAVENOUS
  Administered 2011-03-21: 6 mg via INTRAVENOUS
  Administered 2011-03-21: 4 mg via INTRAVENOUS

## 2011-03-21 MED ORDER — LIDOCAINE-EPINEPHRINE 1 %-1:100000 IJ SOLN
INTRAMUSCULAR | Status: DC | PRN
  Administered 2011-03-21: 30 mL

## 2011-03-21 MED ORDER — LIP MEDEX EX OINT
TOPICAL_OINTMENT | CUTANEOUS | Status: AC
  Administered 2011-03-21: 12:00:00
  Filled 2011-03-21: qty 7

## 2011-03-21 MED ORDER — DEXAMETHASONE SODIUM PHOSPHATE 10 MG/ML IJ SOLN
INTRAMUSCULAR | Status: DC | PRN
  Administered 2011-03-21: 10 mg via INTRAVENOUS

## 2011-03-21 MED ORDER — ENOXAPARIN SODIUM 40 MG/0.4ML ~~LOC~~ SOLN
40.0000 mg | Freq: Once | SUBCUTANEOUS | Status: AC
  Administered 2011-03-21: 40 mg via SUBCUTANEOUS

## 2011-03-21 MED ORDER — PROMETHAZINE HCL 25 MG/ML IJ SOLN: 6.2500 mg | INTRAMUSCULAR | Status: DC | PRN

## 2011-03-21 MED ORDER — OXYCODONE-ACETAMINOPHEN 5-325 MG/5ML PO SOLN
5.0000 mL | ORAL | Status: DC | PRN
  Administered 2011-03-22 – 2011-03-23 (×2): 5 mL via ORAL
  Filled 2011-03-21: qty 5
  Filled 2011-03-21: qty 10

## 2011-03-21 MED ORDER — HYDROMORPHONE HCL PF 1 MG/ML IJ SOLN
0.2500 mg | INTRAMUSCULAR | Status: DC | PRN
  Administered 2011-03-21 (×3): 0.5 mg via INTRAVENOUS

## 2011-03-21 MED ORDER — LACTATED RINGERS IV SOLN: INTRAVENOUS | Status: DC

## 2011-03-21 MED ORDER — ONDANSETRON HCL 4 MG/2ML IJ SOLN
INTRAMUSCULAR | Status: DC | PRN
  Administered 2011-03-21: 4 mg via INTRAVENOUS

## 2011-03-21 MED ORDER — ACETAMINOPHEN 10 MG/ML IV SOLN
INTRAVENOUS | Status: AC
  Filled 2011-03-21: qty 100

## 2011-03-21 MED ORDER — HYDROMORPHONE HCL PF 1 MG/ML IJ SOLN
INTRAMUSCULAR | Status: AC
  Filled 2011-03-21: qty 1

## 2011-03-21 MED ORDER — SODIUM CHLORIDE 0.9 % IV SOLN
1.0000 g | INTRAVENOUS | Status: AC
  Administered 2011-03-21: 1 g via INTRAVENOUS

## 2011-03-21 MED ORDER — ONDANSETRON HCL 4 MG/2ML IJ SOLN
4.0000 mg | INTRAMUSCULAR | Status: DC | PRN
  Administered 2011-03-21 – 2011-03-22 (×5): 4 mg via INTRAVENOUS
  Filled 2011-03-21 (×5): qty 2

## 2011-03-21 MED ORDER — SODIUM CHLORIDE 0.9 % IV SOLN
INTRAVENOUS | Status: AC
  Filled 2011-03-21: qty 1

## 2011-03-21 MED ORDER — LIDOCAINE-EPINEPHRINE 1 %-1:100000 IJ SOLN
INTRAMUSCULAR | Status: AC
  Filled 2011-03-21: qty 1

## 2011-03-21 MED ORDER — ACETAMINOPHEN 160 MG/5ML PO SOLN: 650.0000 mg | ORAL | Status: DC | PRN

## 2011-03-21 MED ORDER — PROPOFOL 10 MG/ML IV BOLUS
INTRAVENOUS | Status: DC | PRN
  Administered 2011-03-21: 200 mg via INTRAVENOUS

## 2011-03-21 MED ORDER — KCL IN DEXTROSE-NACL 20-5-0.45 MEQ/L-%-% IV SOLN
INTRAVENOUS | Status: DC
  Administered 2011-03-21: 125 mL/h via INTRAVENOUS
  Administered 2011-03-21 – 2011-03-22 (×2): via INTRAVENOUS
  Administered 2011-03-22 (×2): 125 mL/h via INTRAVENOUS
  Administered 2011-03-23: 02:00:00 via INTRAVENOUS
  Filled 2011-03-21 (×8): qty 1000

## 2011-03-21 MED ORDER — MORPHINE SULFATE 2 MG/ML IJ SOLN
2.0000 mg | INTRAMUSCULAR | Status: DC | PRN
  Administered 2011-03-21: 2 mg via INTRAVENOUS
  Administered 2011-03-21 (×2): 4 mg via INTRAVENOUS
  Administered 2011-03-21: 2 mg via INTRAVENOUS
  Administered 2011-03-21: 4 mg via INTRAVENOUS
  Administered 2011-03-21: 2 mg via INTRAVENOUS
  Administered 2011-03-21: 4 mg via INTRAVENOUS
  Administered 2011-03-22 (×6): 2 mg via INTRAVENOUS
  Administered 2011-03-22: 4 mg via INTRAVENOUS
  Administered 2011-03-22 (×5): 2 mg via INTRAVENOUS
  Administered 2011-03-22: 4 mg via INTRAVENOUS
  Administered 2011-03-23 (×2): 2 mg via INTRAVENOUS
  Filled 2011-03-21: qty 1
  Filled 2011-03-21 (×3): qty 2
  Filled 2011-03-21 (×4): qty 1
  Filled 2011-03-21 (×6): qty 2
  Filled 2011-03-21 (×4): qty 1
  Filled 2011-03-21: qty 2
  Filled 2011-03-21 (×2): qty 1

## 2011-03-21 MED ORDER — DROPERIDOL 2.5 MG/ML IJ SOLN
INTRAMUSCULAR | Status: DC | PRN
  Administered 2011-03-21: 0.625 mg via INTRAVENOUS

## 2011-03-21 MED ORDER — ACETAMINOPHEN 10 MG/ML IV SOLN
INTRAVENOUS | Status: DC | PRN
  Administered 2011-03-21: 1000 mg via INTRAVENOUS

## 2011-03-21 MED ORDER — ENOXAPARIN SODIUM 40 MG/0.4ML ~~LOC~~ SOLN
SUBCUTANEOUS | Status: AC
  Filled 2011-03-21: qty 0.4

## 2011-03-21 MED ORDER — MIDAZOLAM HCL 5 MG/5ML IJ SOLN
INTRAMUSCULAR | Status: DC | PRN
  Administered 2011-03-21: 2 mg via INTRAVENOUS

## 2011-03-21 MED ORDER — FENTANYL CITRATE 0.05 MG/ML IJ SOLN
INTRAMUSCULAR | Status: DC | PRN
  Administered 2011-03-21 (×2): 50 ug via INTRAVENOUS
  Administered 2011-03-21: 100 ug via INTRAVENOUS
  Administered 2011-03-21 (×6): 50 ug via INTRAVENOUS

## 2011-03-21 MED ORDER — BUPIVACAINE HCL (PF) 0.25 % IJ SOLN
INTRAMUSCULAR | Status: AC
  Filled 2011-03-21: qty 30

## 2011-03-21 MED ORDER — 0.9 % SODIUM CHLORIDE (POUR BTL) OPTIME
TOPICAL | Status: DC | PRN
  Administered 2011-03-21: 1000 mL

## 2011-03-21 MED ORDER — GLYCOPYRROLATE 0.2 MG/ML IJ SOLN
INTRAMUSCULAR | Status: DC | PRN
  Administered 2011-03-21: .5 mg via INTRAVENOUS

## 2011-03-21 MED ORDER — LACTATED RINGERS IV SOLN
INTRAVENOUS | Status: DC | PRN
  Administered 2011-03-21 (×2): via INTRAVENOUS

## 2011-03-21 MED ORDER — NEOSTIGMINE METHYLSULFATE 1 MG/ML IJ SOLN
INTRAMUSCULAR | Status: DC | PRN
  Administered 2011-03-21: 4 mg via INTRAVENOUS

## 2011-03-21 MED FILL — Fibrin Sealant Component Kit 5 ML: CUTANEOUS | Qty: 2 | Status: AC

## 2011-03-21 SURGICAL SUPPLY — 59 items
APPLICATOR COTTON TIP 6IN STRL (MISCELLANEOUS) IMPLANT
APPLIER CLIP ROT 10 11.4 M/L (STAPLE)
BLADE SURG 15 STRL LF DISP TIS (BLADE) ×2 IMPLANT
BLADE SURG 15 STRL SS (BLADE) ×1
BLADE SURG SZ10 CARB STEEL (BLADE) IMPLANT
BLADE SURG SZ11 CARB STEEL (BLADE) ×3 IMPLANT
CABLE HIGH FREQUENCY MONO STRZ (ELECTRODE) IMPLANT
CANISTER SUCTION 2500CC (MISCELLANEOUS) ×3 IMPLANT
CHLORAPREP W/TINT 26ML (MISCELLANEOUS) ×6 IMPLANT
CLIP APPLIE ROT 10 11.4 M/L (STAPLE) IMPLANT
CLOTH BEACON ORANGE TIMEOUT ST (SAFETY) ×3 IMPLANT
COVER SURGICAL LIGHT HANDLE (MISCELLANEOUS) ×3 IMPLANT
DERMABOND ADVANCED (GAUZE/BANDAGES/DRESSINGS) ×1
DERMABOND ADVANCED .7 DNX12 (GAUZE/BANDAGES/DRESSINGS) ×2 IMPLANT
DEVICE SUTURE ENDOST 10MM (ENDOMECHANICALS) IMPLANT
DEVICE TROCAR PUNCTURE CLOSURE (ENDOMECHANICALS) ×3 IMPLANT
DRAIN CHANNEL 19F RND (DRAIN) ×3 IMPLANT
DRAPE CAMERA CLOSED 9X96 (DRAPES) ×3 IMPLANT
ELECT REM PT RETURN 9FT ADLT (ELECTROSURGICAL) ×3
ELECTRODE REM PT RTRN 9FT ADLT (ELECTROSURGICAL) ×2 IMPLANT
EVACUATOR DRAINAGE 10X20 100CC (DRAIN) ×2 IMPLANT
EVACUATOR SILICONE 100CC (DRAIN) ×1
GLOVE SURG SS PI 7.5 STRL IVOR (GLOVE) ×6 IMPLANT
GOWN STRL NON-REIN LRG LVL3 (GOWN DISPOSABLE) ×6 IMPLANT
GOWN STRL REIN XL XLG (GOWN DISPOSABLE) ×6 IMPLANT
HOVERMATT SINGLE USE (MISCELLANEOUS) ×3 IMPLANT
KIT BASIN OR (CUSTOM PROCEDURE TRAY) ×3 IMPLANT
NEEDLE HYPO 22GX1.5 SAFETY (NEEDLE) ×3 IMPLANT
NEEDLE SPNL 22GX3.5 QUINCKE BK (NEEDLE) ×3 IMPLANT
NS IRRIG 1000ML POUR BTL (IV SOLUTION) ×3 IMPLANT
PACK UNIVERSAL I (CUSTOM PROCEDURE TRAY) ×3 IMPLANT
PEN SKIN MARKING BROAD (MISCELLANEOUS) ×3 IMPLANT
PENCIL BUTTON HOLSTER BLD 10FT (ELECTRODE) ×3 IMPLANT
POUCH SPECIMEN RETRIEVAL 10MM (ENDOMECHANICALS) IMPLANT
RELOAD BLACK 60MM ECHELON (STAPLE) ×6 IMPLANT
RELOAD GREEN (STAPLE) ×12 IMPLANT
SCISSORS LAP 5X35 DISP (ENDOMECHANICALS) IMPLANT
SEALANT SURGICAL APPL DUAL CAN (MISCELLANEOUS) ×3 IMPLANT
SET IRRIG TUBING LAPAROSCOPIC (IRRIGATION / IRRIGATOR) ×3 IMPLANT
SHEARS CURVED HARMONIC AC 45CM (MISCELLANEOUS) ×3 IMPLANT
SLEEVE XCEL OPT CAN 5 100 (ENDOMECHANICALS) ×6 IMPLANT
SOLUTION ANTI FOG 6CC (MISCELLANEOUS) ×3 IMPLANT
SPONGE GAUZE 4X4 12PLY (GAUZE/BANDAGES/DRESSINGS) IMPLANT
SPONGE LAP 18X18 X RAY DECT (DISPOSABLE) ×3 IMPLANT
STAPLE ECHEON FLEX 60 POW ENDO (STAPLE) ×3 IMPLANT
STRIP PERI DRY VERITAS 60 (STAPLE) ×18 IMPLANT
SUT ETHILON 3 0 PS 1 (SUTURE) ×3 IMPLANT
SUT MNCRL AB 4-0 PS2 18 (SUTURE) ×6 IMPLANT
SUT PDS AB 0 CT1 36 (SUTURE) ×3 IMPLANT
SUT VICRYL 0 UR6 27IN ABS (SUTURE) ×9 IMPLANT
SYR 20CC LL (SYRINGE) ×3 IMPLANT
SYR 50ML LL SCALE MARK (SYRINGE) ×3 IMPLANT
SYR CONTROL 10ML LL (SYRINGE) ×3 IMPLANT
TRAY FOLEY CATH 14FRSI W/METER (CATHETERS) IMPLANT
TROCAR BLADELESS 15MM (ENDOMECHANICALS) ×3 IMPLANT
TROCAR BLADELESS OPT 5 100 (ENDOMECHANICALS) ×3 IMPLANT
TUBING CONNECTING 10 (TUBING) ×3 IMPLANT
TUBING ENDO SMARTCAP (MISCELLANEOUS) ×3 IMPLANT
TUBING FILTER THERMOFLATOR (ELECTROSURGICAL) ×3 IMPLANT

## 2011-03-21 NOTE — Progress Notes (Signed)
Spoke with patient regarding Lovenox injection order and anticoagulants having pork derived products, patient and patients husband stated that injections were ok for her to take Means, Akeen Ledyard N 03-21-11 13:08pm

## 2011-03-21 NOTE — H&P (View-Only) (Signed)
Patient ID: Valerie Sweeney, female   DOB: 09/15/1965, 45 y.o.   MRN: [id number]  Chief Complaint  Patient presents with  . Bariatric Pre-op    gastric sleeve    HPI Valerie Sweeney is a 45 y.o. female. This patient presents today for preoperative visit for laparoscopic vertical sleeve gastrectomy for obesity. She has a BMI of 47 with hypertension, arthritis, and GERD. She has completed her psychology and nutrition evaluations and laboratory studies and has been cleared for surgery. She had an upper GI which demonstrated a small hiatal hernia.  She has lost 10lbs on her preop diet. HPI  Past Medical History  Diagnosis Date  . Hypertension   . GERD (gastroesophageal reflux disease)   . Allergy   . Anemia     Past Surgical History  Procedure Date  . Cholecystectomy   . Breath tek h pylori 01/24/2011    Procedure: BREATH TEK H PYLORI;  Surgeon: David H Newman, MD;  Location: [facility];  Service: General;  Laterality: N/A;    Family History  Problem Relation Age of Onset  . Diabetes Mother   . Stroke Father   . Hypertension Father   . Cancer Neg Hx     Social History History  Substance Use Topics  . Smoking status: Never Smoker   . Smokeless tobacco: Never Used  . Alcohol Use: No    Allergies  Allergen Reactions  . Pork-Derived Products Other (See Comments)    Belief     Current Outpatient Prescriptions  Medication Sig Dispense Refill  . calcium citrate-vitamin D 200-200 MG-UNIT TABS Take 1 tablet by mouth daily.        . Iron 66 MG TABS Take 1 tablet (66 mg total) by mouth daily.  30 tablet  11  . lisinopril (PRINIVIL,ZESTRIL) 20 MG tablet Take 20 mg by mouth at bedtime.      . Multiple Vitamin (MULITIVITAMIN WITH MINERALS) TABS Take 1 tablet by mouth daily.      . triamcinolone cream (KENALOG) 0.1 % Apply 1 application topically as needed. Itching       . levonorgestrel (MIRENA) 20 MCG/24HR IUD 1 each by Intrauterine route once.  1 each  0    Review of  Systems Review of Systems All other review of systems negative or noncontributory except as stated in the HPI  Blood pressure 132/90, pulse 84, temperature 97.9 F (36.6 C), temperature source Temporal, resp. rate 18, height 5' 6" (1.676 m), weight 291 lb 12.8 oz (132.36 kg).  Physical Exam Physical Exam Physical Exam  Nursing note and vitals reviewed. Constitutional: She is oriented to person, place, and time. She appears well-developed and well-nourished. No distress.  HENT:  Head: Normocephalic and atraumatic.  Mouth/Throat: No oropharyngeal exudate.  Eyes: Conjunctivae and EOM are normal. Pupils are equal, round, and reactive to light. Right eye exhibits no discharge. Left eye exhibits no discharge. No scleral icterus.  Neck: Normal range of motion. Neck supple. No tracheal deviation present.  Cardiovascular: Normal rate, regular rhythm, normal heart sounds and intact distal pulses.   Pulmonary/Chest: Effort normal and breath sounds normal. No stridor. No respiratory distress. She has no wheezes.  Abdominal: Soft. Bowel sounds are normal. She exhibits no distension and no mass. There is no tenderness. There is no rebound and no guarding.  Musculoskeletal: Normal range of motion. She exhibits no edema and no tenderness.  Neurological: She is alert and oriented to person, place, and time.  Skin: Skin is warm   and dry. No rash noted. She is not diaphoretic. No erythema. No pallor.  Psychiatric: She has a normal mood and affect. Her behavior is normal. Judgment and thought content normal.   Data Reviewed   Assessment    Morbid obesity Think that she is a good candidate for sleeve gastrectomy. I did discuss with her the procedure as well as the potential risks. We discussed the risks of infection, bleeding, pain, scarring, too much or too little weight loss, staple line leak and need for reoperation. We also discussed the risks of stricture and worsening reflux and the possibility of the  need to convert this to rule out gastric bypass she expressed understanding and desires to proceed with vertical sleeve gastrectomy.    Plan    We will plan for vertical sleeve gastrectomy next week.       Setsuko Robins DAVID 03/14/2011, 3:49 PM    

## 2011-03-21 NOTE — Anesthesia Postprocedure Evaluation (Signed)
  Anesthesia Post-op Note  Patient: Valerie Sweeney  Procedure(s) Performed: Procedure(s) (LRB): LAPAROSCOPIC GASTRIC SLEEVE RESECTION (N/A) UPPER GI ENDOSCOPY ()  Patient Location: PACU  Anesthesia Type: General  Level of Consciousness: awake and alert   Airway and Oxygen Therapy: Patient Spontanous Breathing  Post-op Pain: mild  Post-op Assessment: Post-op Vital signs reviewed, Patient's Cardiovascular Status Stable, Respiratory Function Stable, Patent Airway and No signs of Nausea or vomiting  Post-op Vital Signs: stable  Complications: No apparent anesthesia complications

## 2011-03-21 NOTE — Op Note (Signed)
Preoperative diagnosis: Morbid obesity, BMI 47 Hypertension GERD  Postoperative diagnosis: Same   Procedure: Esophagogastroduodenoscopy   Surgeon: Mary Sella. Magdalen Cabana M.D., FACS   Anesthesia: Gen.   Indications for procedure: 46 year old female undergoing Laparoscopic Gastric Sleeve Resection and an EGD was requested to evaluate the new gastric sleeve.  Description of procedure: After we have completed the sleeve resection, I scrubbed out and obtained the Olympus endoscope. I gently placed endoscope in the patient's oropharynx and gently glided it down the esophagus without any difficulty under direct visualization. Once I was in the gastric sleeve, I insufflated the stomach with air.  I was able to cannulate and advanced the scope through the gastric sleeve. I was able to cannulate the duodenum with ease. Dr. Biagio Quint had placed saline in the upper abdomen. Upon further insufflation of the gastric sleeve there was no evidence of bubbles. Upon further inspection of the gastric sleeve, the mucosa appeared normal. There is no evidence of any mucosal abnormality.  There was no evidence of bleeding. The gastric sleeve was decompressed. The scope was withdrawn. The patient tolerated this portion of the procedure well. Please see Dr Delice Lesch operative note for details regarding the laparoscopic gastric sleeve resection.  Mary Sella. Andrey Campanile, MD, FACS General, Bariatric, & Minimally Invasive Surgery [facility], [place]

## 2011-03-21 NOTE — Brief Op Note (Signed)
03/21/2011  9:45 AM  PATIENT:  Valerie Sweeney  46 y.o. female  PRE-OPERATIVE DIAGNOSIS:  morbid obesity   POST-OPERATIVE DIAGNOSIS:  morbid obesity  PROCEDURE:  Procedure(s) (LRB): LAPAROSCOPIC GASTRIC SLEEVE RESECTION (N/A) UPPER GI ENDOSCOPY ()  SURGEON:  Surgeon(s) and Role:    * Lodema Pilot, DO - Primary    * Atilano Ina, MD,FACS - Assisting  PHYSICIAN ASSISTANT:   ASSISTANTS: Wilson   ANESTHESIA:   general  EBL:  Total I/O In: 1300 [I.V.:1300] Out: -   BLOOD ADMINISTERED:none  DRAINS: (35F) Jackson-Pratt drain(s) with closed bulb suction in the LUQ   LOCAL MEDICATIONS USED:  MARCAINE    and LIDOCAINE   SPECIMEN:  Source of Specimen:  gastric sleeve  DISPOSITION OF SPECIMEN:  PATHOLOGY  COUNTS:  YES  TOURNIQUET:  * No tourniquets in log *  DICTATION: .Other Dictation: Dictation Number [id number]  PLAN OF CARE: Admit to inpatient   PATIENT DISPOSITION:  PACU - hemodynamically stable.   Delay start of Pharmacological VTE agent (>24hrs) due to surgical blood loss or risk of bleeding: no

## 2011-03-21 NOTE — Preoperative (Signed)
Beta Blockers   Reason not to administer Beta Blockers:Not Applicable 

## 2011-03-21 NOTE — Transfer of Care (Signed)
Immediate Anesthesia Transfer of Care Note  Patient: Valerie Sweeney  Procedure(s) Performed: Procedure(s) (LRB): LAPAROSCOPIC GASTRIC SLEEVE RESECTION (N/A) UPPER GI ENDOSCOPY ()  Patient Location: PACU  Anesthesia Type: General  Level of Consciousness: awake, alert , oriented and patient cooperative  Airway & Oxygen Therapy: Patient Spontanous Breathing and Patient connected to face mask oxygen  Post-op Assessment: Report given to PACU RN and Post -op Vital signs reviewed and stable  Post vital signs: Reviewed and stable  Complications: No apparent anesthesia complications

## 2011-03-21 NOTE — Interval H&P Note (Signed)
History and Physical Interval Note:  03/21/2011 7:09 AM  Valerie Sweeney  has presented today for surgery, with the diagnosis of morbid obesity   The various methods of treatment have been discussed with the patient and family. After consideration of risks, benefits and other options for treatment, the patient has consented to  Procedure(s) (LRB): LAPAROSCOPIC GASTRIC SLEEVE RESECTION (N/A) as a surgical intervention .  The patients' history has been reviewed, patient examined, no change in status, stable for surgery.  I have reviewed the patients' chart and labs.  Questions were answered to the patient's satisfaction.   Risks of infection, bleeding, pain, scarring, leaks, need for reoperation or conversion to RYGB, reflux, too much or too little wt loss, vitamin deficiencies, need for protein and vitamin supplementation, stricture, dvt, pe and death again discussed with the patient and she expressed understanding and desires to proceed with sleeve gastrectomy.  Lodema Pilot DAVID

## 2011-03-22 LAB — COMPREHENSIVE METABOLIC PANEL
ALT: 30 U/L (ref 0–35)
AST: 18 U/L (ref 0–37)
Alkaline Phosphatase: 54 U/L (ref 39–117)
CO2: 26 mEq/L (ref 19–32)
Calcium: 9 mg/dL (ref 8.4–10.5)
GFR calc non Af Amer: >90 mL/min (ref >90)
Potassium: 3.5 mEq/L (ref 3.5–5.1)
Sodium: 132 mEq/L — ABNORMAL LOW (ref 135–145)
Total Protein: 7.5 g/dL (ref 6.0–8.3)

## 2011-03-22 LAB — CBC
HCT: 36.9 % (ref 36.0–46.0)
Hemoglobin: 11.9 g/dL — ABNORMAL LOW (ref 12.0–15.0)
MCH: 25.5 pg — ABNORMAL LOW (ref 26.0–34.0)
MCV: 79.2 fL (ref 78.0–100.0)
RBC: 4.66 MIL/uL (ref 3.87–5.11)
WBC: 10.5 10*3/uL (ref 4.0–10.5)

## 2011-03-22 LAB — DIFFERENTIAL
Eosinophils Absolute: 0 10*3/uL (ref 0.0–0.7)
Eosinophils Relative: 0 % (ref 0–5)
Lymphocytes Relative: 24 % (ref 12–46)
Lymphs Abs: 2.5 10*3/uL (ref 0.7–4.0)
Monocytes Relative: 11 % (ref 3–12)

## 2011-03-22 MED ORDER — KETOROLAC TROMETHAMINE 30 MG/ML IJ SOLN
30.0000 mg | Freq: Four times a day (QID) | INTRAMUSCULAR | Status: DC | PRN
  Administered 2011-03-22 – 2011-03-23 (×4): 30 mg via INTRAVENOUS
  Filled 2011-03-22 (×4): qty 1

## 2011-03-22 NOTE — Progress Notes (Signed)
1 Day Post-Op  Subjective: Feeling better. Tolerating water.  Objective: Vital signs in last 24 hours: Temp:  [97.8 F (36.6 C)-99.2 F (37.3 C)] 97.8 F (36.6 C) (03/13 1400) Pulse Rate:  [72-77] 72  (03/13 1400) Resp:  [16-18] 18  (03/13 1400) BP: (126-138)/(69-84) 138/84 mmHg (03/13 1400) SpO2:  [98 %-100 %] 100 % (03/13 1400) Last BM Date: 03/21/11  Intake/Output from previous day: 03/12 0701 - 03/13 0700 In: 3300 [I.V.:3300] Out: 750 [Urine:400; Drains:100] Intake/Output this shift: Total I/O In: 1300 [P.O.:300; I.V.:1000] Out: 5 [Drains:5]  she looks good and abdomen and drain appropriate.  Lab Results:   [facility] 03/22/11 0435  WBC 10.5  HGB 11.9*  HCT 36.9  PLT 356   BMET  Basename 03/22/11 0435  NA 132*  K 3.5  CL 98  CO2 26  GLUCOSE 117*  BUN 4*  CREATININE 0.50  CALCIUM 9.0   PT/INR No results found for this basename: LABPROT:2,INR:2 in the last 72 hours ABG No results found for this basename: PHART:2,PCO2:2,PO2:2,HCO3:2 in the last 72 hours  Studies/Results: No results found.  Anti-infectives: Anti-infectives     Start     Dose/Rate Route Frequency Ordered Stop   03/21/11 0600   ertapenem (INVANZ) 1 g in sodium chloride 0.9 % 50 mL IVPB        1 g 100 mL/hr over 30 Minutes Intravenous 60 min pre-op 03/21/11 0557 03/21/11 0733          Assessment/Plan: s/p Procedure(s) (LRB): LAPAROSCOPIC GASTRIC SLEEVE RESECTION (N/A) UPPER GI ENDOSCOPY () will trail clears  LOS: 1 day    Lodema Pilot DAVID 03/22/2011

## 2011-03-22 NOTE — Progress Notes (Signed)
1 Day Post-Op  Subjective: C/o nausea and belching  Objective: Vital signs in last 24 hours: Temp:  [97.9 F (36.6 C)-99.5 F (37.5 C)] 97.9 F (36.6 C) (03/13 0628) Pulse Rate:  [70-84] 77  (03/13 0628) Resp:  [12-21] 18  (03/13 0628) BP: (126-159)/(69-84) 126/81 mmHg (03/13 0628) SpO2:  [94 %-99 %] 99 % (03/13 0628) Weight:  [299 lb 2.6 oz (135.7 kg)] 299 lb 2.6 oz (135.7 kg) (03/12 1123) Last BM Date: 03/21/11  Intake/Output from previous day: 03/12 0701 - 03/13 0700 In: 3300 [I.V.:3300] Out: 750 [Urine:400; Drains:100] Intake/Output this shift: Total I/O In: -  Out: 5 [Drains:5]  General appearance: alert, cooperative and no distress Resp: nonlabored Cardio: normal rate, regular GI: soft, appropriate upper abdominal tenderness, wounds without infection, ND, JP ss output, no peritoneal signs  Lab Results:   [facility] 03/22/11 0435  WBC 10.5  HGB 11.9*  HCT 36.9  PLT 356   BMET  Basename 03/22/11 0435  NA 132*  K 3.5  CL 98  CO2 26  GLUCOSE 117*  BUN 4*  CREATININE 0.50  CALCIUM 9.0   PT/INR No results found for this basename: LABPROT:2,INR:2 in the last 72 hours ABG No results found for this basename: PHART:2,PCO2:2,PO2:2,HCO3:2 in the last 72 hours  Studies/Results: No results found.  Anti-infectives: Anti-infectives     Start     Dose/Rate Route Frequency Ordered Stop   03/21/11 0600   ertapenem (INVANZ) 1 g in sodium chloride 0.9 % 50 mL IVPB        1 g 100 mL/hr over 30 Minutes Intravenous 60 min pre-op 03/21/11 0557 03/21/11 0733          Assessment/Plan: s/p Procedure(s) (LRB): LAPAROSCOPIC GASTRIC SLEEVE RESECTION (N/A) UPPER GI ENDOSCOPY () given nausea will be slow to advance diet.  LOS: 1 day    Lodema Pilot DAVID 03/22/2011

## 2011-03-22 NOTE — Progress Notes (Signed)
Pt alert and oriented but sleepy from prn pain meds ;VSS;  c/o nausea but denies vomiting; denies flatus or BM; voiding without difficulty; sipping water; encouraged ambulation and pt verbalized understanding of; encouraged use of incentive spirometer and pt verbalized understanding of; discharge instructions given for pt to review and will complete tomorrow.  GASTRIC BYPASS/SLEEVE DISCHARGE INSTRUCTIONS  Drs. Fredrik Rigger, [place], Wilson, and [place] Call if you have any problems.   Call [phone] and ask for the surgeon on call.    If you need immediate assistance come to the ER at [facility]. Tell the ER personnel that you are a new post-op gastric bypass patient. Signs and symptoms to report:   Severe vomiting or nausea. If you cannot tolerate clear liquids for longer than 1 day, you need to call your surgeon.    Abdominal pain which does not get better after taking your pain medication   Fever greater than 101 F degree   Difficulty breathing   Chest pain    Redness, swelling, drainage, or foul odor at incision sites    If your incisions open or pull apart   Swelling or pain in calf (lower leg)   Diarrhea, frequent watery, uncontrolled bowel movements.   Constipation, (no bowel movements for 3 days) if this occurs, Take Milk of Magnesia, 2 tablespoons by mouth, 3 times a day for 2 days if needed.  Call your doctor if constipation continues. Stop taking Milk of Magnesia once you have had a bowel movement. You may also use Miralax according to the label instructions.   Anything you consider "abnormal for you".   Normal side effects after Surgery:   Unable to sleep at night or concentrate   Irritability   Being tearful (crying) or depressed   These are common complaints, possibly related to your anesthesia, stress of surgery and change in lifestyle, that usually go away a few weeks after surgery.  If these feelings continue, call your medical doctor.  Wound Care You may have  surgical glue, steri-strips, or staples over your incisions after surgery.  Surgical glue:  Looks like a clear film over your incisions and will wear off gradually. Steri-strips: Strips of tape over your incisions. You may notice a yellowish color on the skin underneath the steri-strips. This is a substance used to make the steri-strips stick better. Do not pull the steri-strips off - let them fall off.  Staples: Cherlynn Polo may be removed before you leave the hospital. If you go home with staples, call [facility] [phone]) for an appointment with your surgeon's nurse to have staples removed in 7 - 10 days. Showering: You may shower two days after your surgery unless otherwise instructed by your surgeon. Wash gently around wounds with warm soapy water, rinse well, and gently pat dry.  If you have a drain, you may need someone to hold this while you shower. Avoid tub baths until staples are removed and incisions are healed.    Medications   Medications should be liquid or crushed if larger than the size of a dime.  Extended release pills should not be crushed.   Depending on the size and number of medications you take, you may need to stagger/change the time you take your medications so that you do not over-fill your pouch.    Make sure you follow-up with your primary care physician to make medication adjustments needed during rapid weight loss and life-style adjustment.   If you are diabetic, follow up with the  doctor that prescribes your diabetes medication(s) within one week after surgery and check your blood sugar regularly.   Do not drive while taking narcotics!   Do not take acetaminophen (Tylenol) and Roxicet or Lortab Elixir at the same time since these pain medications contain acetaminophen.  Diet at home: (First 2 Weeks) You will see the nutritionist two weeks after your surgery. She will advance your diet if you are tolerating liquids well. Once at home, if you have severe  vomiting or nausea and cannot tolerate clear liquids lasting longer than 1 day, call your surgeon.  Begin high protein shake 2 ounces every 3 hours, 5 - 6 times per day.  Gradually increase the amount you drink as tolerated.  You may find it easier to slowly sip shakes throughout the day.  It is important to get your proteins in first.   Protein Shake   Drink at least 2 ounces of shake 5-6 times per day   Each serving of protein shakes should have a minimum of 15 grams of protein and no more than 5 grams of carbohydrate    Increase the amount of protein shake you drink as tolerated   Protein powder may be added to fluids such as non-fat milk or Lactaid milk (limit to 20 grams added protein powder per serving   The initial goal is to drink at least 8 ounces of protein shake/drink per day (or as directed by the nutritionist). Some examples of protein shakes are [employer], [employer], EAS Edge HP, and [employer]jury. Hydration   Gradually increase the amount of water and other liquids as tolerated (See Acceptable Fluids)   Gradually increase the amount of protein shake as tolerated     Sip fluids slowly and throughout the day   May use Sugar substitutes, use sparingly (limit to 6 - 8 packets per day). Your fluid goal is 64 ounces of fluid daily. It may take a few weeks to build up to this.         32 oz (or more) should be clear liquids and 32 oz (or more) should be full liquids.         Liquids should not contain sugar, caffeine, or carbonation! Acceptable Fluids Clear Liquids:   Water or Sugar-free flavored water, Fruit H2O   Decaffeinated coffee or tea (sugar-free)   Crystal Lite, Wyler's Lite, Minute Maid Lite   Sugar-free Jell-O   Bouillon or broth   Sugar-free Popsicle:   *Less than 20 calories each; Limit 1 per day   Full Liquids:              Protein Shakes/Drinks + 2 choices per day of other full liquids shown below.    Other full liquids must be: No more than 12 grams of Carbs  per serving,  No more than 3 grams of Fat per serving   Strained low-fat cream soup   Non-Fat milk   Fat-free Lactaid Milk   Sugar-free yogurt (Dannon Lite & Fit) Vitamins and Minerals (Start 1 day after surgery unless otherwise directed)   2 Chewable Multivitamin / Multimineral Supplement (i.e. Centrum for Adults)   Chewable Calcium Citrate with Vitamin D-3. Take 1500 mg each day.           (Example: 3 Chewable Calcium Plus 600 with Vitamin D-3 can be found at [facility])         Vitamin B-12, 350 - 500 micrograms (oral tablet) each day   Do not mix multivitamins containing iron  with calcium supplements; take 2 hours   apart   Do not substitute Tums (calcium carbonate) for your calcium   Menstruating women and those at risk for anemia may need extra iron. Talk with your doctor to see if you need additional iron.    If you need extra iron:  Total daily Iron recommendations (including Vitamins) = 50 - 100 mg Iron/day Do not stop taking or change any vitamins or minerals until you talk to your nutritionist or surgeon. Your nutritionist and / or physician must approve all vitamin and mineral supplements. Exercise For maximum success, begin exercising as soon as your doctor recommends. Make sure your physician approves any physical activity.   Depending on fitness level, begin with a simple walking program   Walk 5-15 minutes each day, 7 days per week.    Slowly increase until you are walking 30-45 minutes per day   Consider joining our BELT program. [phone] or email [email or web address] Things to remember:    You may have sexual relations when you feel comfortable. It is VERY important for female patients to use a reliable birth control method. Fertility often increases after surgery. Do not get pregnant for at least 18 months.   It is very important to keep all follow up appointments with your surgeon, nutritionist, primary care physician, and behavioral health practitioner. After the first year,  please follow up with your bariatric surgeon at least once a year in order to maintain best weight loss results.  [facility]: [phone] [facility]: [phone]   Free counseling is available for you and your family through collaboration between [facility] and [place]. Please call [phone] and leave a message.    Consider purchasing a medical alert bracelet that says you had gastric bypass surgery.    [facility] has a free Bariatric Surgery Support Group that meets monthly, the 3rd Thursday, 6 pm, Classroom #1, [place]. You may register online at [email or web address], but registration is not necessary. Select Classes and Support Groups, Bariatric Surgery, or Call [phone]   Do not return to work or drive until cleared by your surgeon   Use your CPAP when sleeping if applicable   Do not lift anything greater than ten pounds for at least two weeks  Talmadge Chad, RN Bariatric Nurse  Coordinator

## 2011-03-22 NOTE — Progress Notes (Signed)
Pt has been educated regarding correct use of Incentive Spirometer (IS). Pt verbalizes understanding of correct use. Pt states that there is no further questions regarding correct use of the IS.Aeson Sawyers L, RN    

## 2011-03-22 NOTE — Op Note (Signed)
Valerie Sweeney, RODWELL NO.:  [id number]  MEDICAL RECORD NO.:  [id number]  LOCATION:  1523                         FACILITY:  [facility]  PHYSICIAN:  Lodema Pilot, MD       DATE OF BIRTH:  11/16/1965  DATE OF PROCEDURE:  03/21/2011 DATE OF DISCHARGE:                              OPERATIVE REPORT   PROCEDURE:  Laparoscopic vertical sleeve gastrectomy with intraoperative esophagogastroduodenoscopy.  PREOPERATIVE DIAGNOSIS:  Morbid obesity.  POSTOPERATIVE DIAGNOSIS:  Morbid obesity.  SURGEON:  Lodema Pilot, MD  ASSISTANT:  Dr. Andrey Campanile.  ANESTHESIA:  General endotracheal tube anesthesia with 60 cc of 1% lidocaine with epinephrine and 0.25% Marcaine in a 50:50 mixture.  FLUIDS:  1200 cc crystalloid.  ESTIMATED BLOOD LOSS:  Minimal.  DRAINS:  Nineteen-French Blake drain placed along the gastric staple line and exited through the left upper quadrant.  SPECIMEN:  A sleeve gastrectomy sent to Pathology for permanent section.  COMPLICATIONS:  None apparent.  FINDINGS:  Sleeve gastrectomy with 36-French bougie.  Drain placed along the staple line.  She also had an incisional hernia in the epigastric area from her previous lap chole trocar site and the omentum was removed from this hernia and the falciform was used to cover the defect to prevent reherniation of the intestinal contents.  INDICATIONS FOR PROCEDURE:  Valerie Sweeney is a 46 year old female with a BMI of 47 and hypertension, who has failed medical weight loss attempts and desires stable weight loss solutions.  OPERATIVE DETAILS:  Valerie Sweeney was seen and evaluated in the preop area and risks and benefits of procedure were again discussed in lay terms. Informed consent was obtained.  Prophylactic antibiotics were given, and Lovenox was administered preoperatively.  I again discussed with her the risks of worsening reflux and the need for conversion to Roux-en-Y gastric bypass as well as the staple line  leaks and the stricture, and she expressed understanding and desired to proceed with a sleeve gastrectomy.  DESCRIPTION OF PROCEDURE:  She was then taken to the operating room, placed on table in a supine position, and general endotracheal tube anesthesia was obtained.  Her abdomen was prepped and draped in a standard surgical fashion, and the procedure time-out was performed with all operative team members to confirm appropriate patient and procedure, and the abdomen was accessed in the left upper quadrant using a 5-mm Optiview trocar.  A 5-mm left rectus trocar was placed under direct visualization and a 5-mm right upper quadrant trocar and a right rectus 15-mm port site trocar was placed under direct visualization.  She was noted to have some omentum adhered to the abdominal wall, which actually turned out to be a hernia defect.  I swept the fat out of the hernia defect and she was noted to have a fascial defect just to the left of the falciform ligament in the midline under her previous lap chole trocar site.  A Nathanson liver retractor was placed through the epigastrium and the left lobe of liver was retracted.  Then the left upper quadrant omental adhesions were taken down with blunt dissection and a slight amount with Harmonic Scalpel.  The OG tube was removed from the  stomach after decompression and they measured it 5 cm from the pylorus and began mobilizing the greater curvature of the stomach. Using the Harmonic Scalpel to divide the short gastric vessels, the lesser sac was entered and carried the dissection along the greater curvature of the stomach up to the spleen and the mobilization from the spleen was actually fairly easy and the stomach was not closely adherent to the spleen.  It was mobilized up to the left crus and the crus was identified and the remainder of its attachment were divided with Harmonic Scalpel.  She did not appear to have any significant  hiatal hernia.  There was no significant lesser curve adhesions.  The stomach was mobilized to the lesser curve and the left gastric vessels.  After we mobilized the stomach, first staple firing was taken starting 5 cm from the pylorus and angled toward the lesser curvature using the black staple load with Peri-Strips, and then the second black staple load was placed on the stomach angled up towards the angle of His; however, before firing the stapler, I passed a 36-French bougie through the mouth along the lesser curvature into the duodenum and the bougie.  It passed without difficulty and a second firing of the stapler was created extending to the patient's right side of the crotch of the last staple firing and I transitioned to green staple load for the remainder of the firings and between 60 mm staple loads covered with Peri-Strips were used for the remainder of the staple firing, stayed along the bougie each time checking to make sure that the bougie was relieved and that we were not pinching the bougie in each firing of the staple load and staying in the crotch of the previous staple line over to the patient's right to avoid the patient's deformation.  This was carried up toward the angle of His wearing slightly off towards the left side to avoid capturing the esophagus in the staple load.  The stomach was completely transected and the staple lines appeared hemostatic.  Then the patient was flattened and the abdomen was irrigated with sterile saline solution, and the EGD was performed, passing the endoscope into the esophagus, stomach, and advanced into the duodenum without difficulty. Mouth in the duodenum was identified and the scope was pulled back into the stomach and the stomach was insufflated, was underwater.  There was no evidence of ulcer or air leakage.  There was no evidence of stricture.  Internal staple line appeared hemostatic.  The gas was suctioned from the lumen and  the scope was removed.  The water was suctioned from the abdomen and the Tisseel fibrin glue was placed along the staple line and the staple line was again noted to be hemostatic. Then the 15-mm right rectus sheath port site was enlarged and the stomach specimen was removed and sent to Pathology for permanent section.  Then the port was replaced in the abdomen in order to prevent herniation of small bowel or colon into the hernia defect that was created from her lap chole incision to the falciform ligament, and folded it over the defect and using an Endoclose device and 0-PDS sutures, sutured the falciform ligament over the defect knowing that this would not be a durable hernia repair, but in order to keep the defect closed to prevent herniation.  A 19-French Blake drain was placed into the abdomen, the spleen, and along the gastric staple line, and the omentum was placed on top of the drain, and  this was exited through the left upper quadrant trocar site and sutured in place with a nylon drain stitch.  The liver retractor was removed and the fascia was approximated in an open fashion with multiple interrupted 0-Vicryl sutures through the right rectus trocar incision.  Sutures were secured and the abdomen was reinsufflated with carbon dioxide gas.  The abdominal wall closure was noted be adequate without any evidence of bowel injury.  The remainder of the trocars were removed.  The abdominal wall was noted to be hemostatic and the extraction site was irrigated with sterile saline solution, and the incisions were injected with total of 60 mL of 1% lidocaine with epinephrine and 0.25% Marcaine in a 50:50 mixture.  The skin edges were approximated with 4-0 Monocryl subcuticular suture.  Skin was washed and dried.  Dermabond was applied. All sponge, needle, and instrument counts were correct at the end of the case.  The patient tolerated procedure well without apparent complications.           ______________________________ Lodema Pilot, MD     BL/MEDQ  D:  03/21/2011  T:  03/22/2011  Job:  [id number]

## 2011-03-23 MED ORDER — OXYCODONE-ACETAMINOPHEN 5-325 MG/5ML PO SOLN: 5.0000 mL | ORAL | Status: AC | PRN

## 2011-03-23 NOTE — Progress Notes (Signed)
2 Days Post-Op  Subjective: Nausea resolved.  Feels well. Pain controlled. Tolerating clears  Objective: Vital signs in last 24 hours: Temp:  [97.8 F (36.6 C)-98.8 F (37.1 C)] 98.5 F (36.9 C) (03/14 0605) Pulse Rate:  [72-110] 92  (03/14 0605) Resp:  [18] 18  (03/14 0605) BP: (115-139)/(75-84) 134/80 mmHg (03/14 0605) SpO2:  [90 %-100 %] 94 % (03/14 0605) Last BM Date: 03/21/11  Intake/Output from previous day: 03/13 0701 - 03/14 0700 In: 2353.2 [P.O.:720; I.V.:1633.2] Out: 1025 [Urine:1000; Drains:25] Intake/Output this shift:    General appearance: alert, cooperative and mild distress Resp: nonlabored Cardio: regular, normal rate GI: nontender, ND, wounds without infection, JP with thin SS output  Lab Results:   Basename 03/22/11 0435  WBC 10.5  HGB 11.9*  HCT 36.9  PLT 356   BMET  Basename 03/22/11 0435  NA 132*  K 3.5  CL 98  CO2 26  GLUCOSE 117*  BUN 4*  CREATININE 0.50  CALCIUM 9.0   PT/INR No results found for this basename: LABPROT:2,INR:2 in the last 72 hours ABG No results found for this basename: PHART:2,PCO2:2,PO2:2,HCO3:2 in the last 72 hours  Studies/Results: No results found.  Anti-infectives: Anti-infectives     Start     Dose/Rate Route Frequency Ordered Stop   03/21/11 0600   ertapenem (INVANZ) 1 g in sodium chloride 0.9 % 50 mL IVPB        1 g 100 mL/hr over 30 Minutes Intravenous 60 min pre-op 03/21/11 0557 03/21/11 0733          Assessment/Plan: s/p Procedure(s) (LRB): LAPAROSCOPIC GASTRIC SLEEVE RESECTION (N/A) UPPER GI ENDOSCOPY () she seems to be doing well.  tolerating diet.  should be okay for discharge later today.  LOS: 2 days    Valerie Sweeney DAVID 03/23/2011

## 2011-03-23 NOTE — Progress Notes (Signed)
She is doing well.  HD stable.  Drain removed.  No complaints.  Abdomen soft, minimal discomfort, wounds okay, drain removed.  Diet discussed.

## 2011-03-23 NOTE — Progress Notes (Signed)
Pt ambulated in hall x2 laps this am. Stable of feet and denies any discomfort this at this time.

## 2011-03-23 NOTE — Progress Notes (Signed)
Pt stated to nurse that MD told her she could drink as much of any clear sugar free liquid she would like. Pt was informed that such information was not noted by the MD in the pt chart.

## 2011-03-23 NOTE — Progress Notes (Signed)
Pt smiling and states she is ready for discharge and is feeling much better today; alert and oriented; VSS; denies any nausea or vomiting today; denies flatus or BM; voiding without difficulty; ambulating in hallways without difficulty; denies pain;  husband at bedside; pt already has follow up appts with [facility] and [facility]; aware of support group and BELT program; discharge instructions reviewed with pt and husband and they verbalized understanding of; questions answered.  GASTRIC BYPASS/SLEEVE DISCHARGE INSTRUCTIONS  Drs. Fredrik Rigger, [place], [place], and [place] Call if you have any problems.   Call [phone] and ask for the surgeon on call.    If you need immediate assistance come to the ER at [facility]. Tell the ER personnel that you are a new post-op gastric bypass patient. Signs and symptoms to report:   Severe vomiting or nausea. If you cannot tolerate clear liquids for longer than 1 day, you need to call your surgeon.    Abdominal pain which does not get better after taking your pain medication   Fever greater than 101 F degree   Difficulty breathing   Chest pain    Redness, swelling, drainage, or foul odor at incision sites    If your incisions open or pull apart   Swelling or pain in calf (lower leg)   Diarrhea, frequent watery, uncontrolled bowel movements.   Constipation, (no bowel movements for 3 days) if this occurs, Take Milk of Magnesia, 2 tablespoons by mouth, 3 times a day for 2 days if needed.  Call your doctor if constipation continues. Stop taking Milk of Magnesia once you have had a bowel movement. You may also use Miralax according to the label instructions.   Anything you consider "abnormal for you".   Normal side effects after Surgery:   Unable to sleep at night or concentrate   Irritability   Being tearful (crying) or depressed   These are common complaints, possibly related to your anesthesia, stress of surgery and change in lifestyle, that usually go away a few  weeks after surgery.  If these feelings continue, call your medical doctor.  Wound Care You may have surgical glue, steri-strips, or staples over your incisions after surgery.  Surgical glue:  Looks like a clear film over your incisions and will wear off gradually. Steri-strips: Strips of tape over your incisions. You may notice a yellowish color on the skin underneath the steri-strips. This is a substance used to make the steri-strips stick better. Do not pull the steri-strips off - let them fall off.  Staples: Cherlynn Polo may be removed before you leave the hospital. If you go home with staples, call [facility] [phone]) for an appointment with your surgeon's nurse to have staples removed in 7 - 10 days. Showering: You may shower two days after your surgery unless otherwise instructed by your surgeon. Wash gently around wounds with warm soapy water, rinse well, and gently pat dry.  If you have a drain, you may need someone to hold this while you shower. Avoid tub baths until staples are removed and incisions are healed.    Medications   Medications should be liquid or crushed if larger than the size of a dime.  Extended release pills should not be crushed.   Depending on the size and number of medications you take, you may need to stagger/change the time you take your medications so that you do not over-fill your pouch.    Make sure you follow-up with your primary care physician to make  medication adjustments needed during rapid weight loss and life-style adjustment.   If you are diabetic, follow up with the doctor that prescribes your diabetes medication(s) within one week after surgery and check your blood sugar regularly.   Do not drive while taking narcotics!   Do not take acetaminophen (Tylenol) and Roxicet or Lortab Elixir at the same time since these pain medications contain acetaminophen.  Diet at home: (First 2 Weeks) You will see the nutritionist two weeks after your  surgery. She will advance your diet if you are tolerating liquids well. Once at home, if you have severe vomiting or nausea and cannot tolerate clear liquids lasting longer than 1 day, call your surgeon.  Begin high protein shake 2 ounces every 3 hours, 5 - 6 times per day.  Gradually increase the amount you drink as tolerated.  You may find it easier to slowly sip shakes throughout the day.  It is important to get your proteins in first.   Protein Shake   Drink at least 2 ounces of shake 5-6 times per day   Each serving of protein shakes should have a minimum of 15 grams of protein and no more than 5 grams of carbohydrate    Increase the amount of protein shake you drink as tolerated   Protein powder may be added to fluids such as non-fat milk or Lactaid milk (limit to 20 grams added protein powder per serving   The initial goal is to drink at least 8 ounces of protein shake/drink per day (or as directed by the nutritionist). Some examples of protein shakes are [employer], [employer]illard[employer], EAS Edge HP, and Unjury. Hydration   Gradually increase the amount of water and other liquids as tolerated (See Acceptable Fluids)   Gradually increase the amount of protein shake as tolerated     Sip fluids slowly and throughout the day   May use Sugar substitutes, use sparingly (limit to 6 - 8 packets per day). Your fluid goal is 64 ounces of fluid daily. It may take a few weeks to build up to this.         32 oz (or more) should be clear liquids and 32 oz (or more) should be full liquids.         Liquids should not contain sugar, caffeine, or carbonation! Acceptable Fluids Clear Liquids:   Water or Sugar-free flavored water, Fruit H2O   Decaffeinated coffee or tea (sugar-free)   Crystal Lite, Wyler's Lite, Minute Maid Lite   Sugar-free Jell-O   Bouillon or broth   Sugar-free Popsicle:   *Less than 20 calories each; Limit 1 per day   Full Liquids:              Protein Shakes/Drinks + 2 choices  per day of other full liquids shown below.    Other full liquids must be: No more than 12 grams of Carbs per serving,  No more than 3 grams of Fat per serving   Strained low-fat cream soup   Non-Fat milk   Fat-free Lactaid Milk   Sugar-free yogurt (Dannon Lite & Fit) Vitamins and Minerals (Start 1 day after surgery unless otherwise directed)   2 Chewable Multivitamin / Multimineral Supplement (i.e. Centrum for Adults)   Chewable Calcium Citrate with Vitamin D-3. Take 1500 mg each day.           (Example: 3 Chewable Calcium Plus 600 with Vitamin D-3 can be found at [facility])  Vitamin B-12, 350 - 500 micrograms (oral tablet) each day   Do not mix multivitamins containing iron with calcium supplements; take 2 hours   apart   Do not substitute Tums (calcium carbonate) for your calcium   Menstruating women and those at risk for anemia may need extra iron. Talk with your doctor to see if you need additional iron.    If you need extra iron:  Total daily Iron recommendations (including Vitamins) = 50 - 100 mg Iron/day Do not stop taking or change any vitamins or minerals until you talk to your nutritionist or surgeon. Your nutritionist and / or physician must approve all vitamin and mineral supplements. Exercise For maximum success, begin exercising as soon as your doctor recommends. Make sure your physician approves any physical activity.   Depending on fitness level, begin with a simple walking program   Walk 5-15 minutes each day, 7 days per week.    Slowly increase until you are walking 30-45 minutes per day   Consider joining our BELT program. [phone] or email [email or web address] [email or web address] Things to remember:    You may have sexual relations when you feel comfortable. It is VERY important for female patients to use a reliable birth control method. Fertility often increases after surgery. Do not get pregnant for at least 18 months.   It is very important to keep all follow up appointments with your  surgeon, nutritionist, primary care physician, and behavioral health practitioner. After the first year, please follow up with your bariatric surgeon at least once a year in order to maintain best weight loss results.  [facility]: [phone] [facility]: [phone]   Free counseling is available for you and your family through collaboration between [facility] and [facility]. Please call [phone] and leave a message.    Consider purchasing a medical alert bracelet that says you had gastric bypass surgery.    [facility] has a free Bariatric Surgery Support Group that meets monthly, the 3rd Thursday, 6 pm, Classroom #1, [place]. You may register online at [email or web address], but registration is not necessary. Select Classes and Support Groups, Bariatric Surgery, or Call [phone]   Do not return to work or drive until cleared by your surgeon   Use your CPAP when sleeping if applicable   Do not lift anything greater than ten pounds for at least two weeks  Talmadge Chad, RN Bariatric Nurse Coordinator

## 2011-03-23 NOTE — Progress Notes (Signed)
Pt has been d/c to the home. Pt and pt's family verbalize understanding of all d/c instructions and state there are no further questions. Pt taken to car via wheelchair. Jerline Linzy L, RN    

## 2011-03-23 NOTE — Discharge Summary (Signed)
  Physician Discharge Summary  Patient ID: Valerie Sweeney MRN: [id number] DOB/AGE: 1965/12/21 46 y.o.  Admit date: 03/21/2011 Discharge date: 03/23/2011  Admission Diagnoses: obesity  Discharge Diagnoses: obesity Active Problems:  * No active hospital problems. *    Discharged Condition: good  Hospital Course: to OR 3/12 for lap sleeve gastrectomy.  Had some nausea POD 1 but resolved and diet advanced.  Tolerating liquids.  Pain controlled.  Drain removed POD 2 and stable for discharge home.  Consults: None  Significant Diagnostic Studies: none  Treatments: surgery: 03/21/11 lap sleeve gastrectomy/EGD  Disposition: 01-Home or Self Care  Discharge Orders    Future Appointments: Provider: Department: Dept Phone: Center:   04/04/2011 4:00 PM [facility] [phone] [facility]   04/06/2011 1:00 PM Lodema Pilot, DO [facility] [phone] None     Future Orders Please Complete By Expires   Increase activity slowly      Discharge instructions      Comments:   Call Leather Estis at [phone] for follow up appointment in 4 weeks. May shower. Take liquid medicines or crush all medications for 4 weeks. Full liquid diet for 1 week, then pureed diet for 1 week, then soft diet for 1 week, then may advance to well chewed/cut regular diet   Call MD for:  temperature >100.4      Call MD for:  persistant nausea and vomiting      Call MD for:  severe uncontrolled pain      Call MD for:  redness, tenderness, or signs of infection (pain, swelling, redness, odor or green/yellow discharge around incision site)      Call MD for:  difficulty breathing, headache or visual disturbances        Medication List  As of 03/23/2011 11:19 AM   STOP taking these medications         calcium citrate-vitamin D 200-200 MG-UNIT Tabs      Iron 66 MG Tabs         TAKE these medications         lisinopril 20 MG tablet   Commonly known as: PRINIVIL,ZESTRIL   Take 20 mg by mouth at  bedtime.      MIRENA 20 MCG/24HR IUD   Generic drug: levonorgestrel   1 each by Intrauterine route once.      mulitivitamin with minerals Tabs   Take 1 tablet by mouth daily.      oxyCODONE-acetaminophen 5-325 MG/5ML solution   Commonly known as: ROXICET   Take 5-10 mLs by mouth every 4 (four) hours as needed.      triamcinolone cream 0.1 %   Commonly known as: KENALOG   Apply 1 application topically as needed. Itching               Signed: Lodema Pilot DAVID 03/23/2011, 11:19 AM

## 2011-03-31 ENCOUNTER — Telehealth (INDEPENDENT_AMBULATORY_CARE_PROVIDER_SITE_OTHER): Payer: Self-pay

## 2011-03-31 NOTE — Telephone Encounter (Signed)
I spoke to Dr Biagio Quint and he advised she can take the Omeprazole but open the capsules and put the medicine into applesauce.  I told her she can take her pain med for the pain but she states it is not that bad.  She will watch it and if no better or gets fever she will call us back.  Dr Biagio Quint may order a ct or ugi.  Pt aware

## 2011-03-31 NOTE — Telephone Encounter (Signed)
The patient called and stated she had the gastric sleeve surgery 3/12.  She has been having heart burn for 3 days.  It interferes with her sleep.  She vomited because of it last night.  She took Omeprazole preop and wonders should she take it again.   She has had left sided pain from the back for a week.  It is dull and she isn't sure if it's muscle pain.  It is constant.  She is not taking any pain medicine.  She is urinating fine and she states her bowel movements are ok.  She has no fever.  I will discuss this with Dr Biagio Quint.

## 2011-04-03 ENCOUNTER — Encounter: Payer: Self-pay | Admitting: Internal Medicine

## 2011-04-03 ENCOUNTER — Telehealth (INDEPENDENT_AMBULATORY_CARE_PROVIDER_SITE_OTHER): Payer: Self-pay

## 2011-04-03 ENCOUNTER — Ambulatory Visit (INDEPENDENT_AMBULATORY_CARE_PROVIDER_SITE_OTHER): Payer: BC Managed Care – PPO | Admitting: Internal Medicine

## 2011-04-03 ENCOUNTER — Other Ambulatory Visit (INDEPENDENT_AMBULATORY_CARE_PROVIDER_SITE_OTHER): Payer: BC Managed Care – PPO

## 2011-04-03 VITALS — BP 108/64 | HR 107 | Temp 97.9°F | Resp 16 | Wt 269.0 lb

## 2011-04-03 DIAGNOSIS — I10 Essential (primary) hypertension: Secondary | ICD-10-CM

## 2011-04-03 DIAGNOSIS — K910 Vomiting following gastrointestinal surgery: Secondary | ICD-10-CM

## 2011-04-03 DIAGNOSIS — R7309 Other abnormal glucose: Secondary | ICD-10-CM

## 2011-04-03 DIAGNOSIS — K219 Gastro-esophageal reflux disease without esophagitis: Secondary | ICD-10-CM

## 2011-04-03 DIAGNOSIS — D649 Anemia, unspecified: Secondary | ICD-10-CM

## 2011-04-03 LAB — CBC WITH DIFFERENTIAL/PLATELET
Basophils Relative: 1.8 % (ref 0.0–3.0)
Eosinophils Absolute: 0.5 10*3/uL (ref 0.0–0.7)
Eosinophils Relative: 7 % — ABNORMAL HIGH (ref 0.0–5.0)
HCT: 44.2 % (ref 36.0–46.0)
Lymphs Abs: 2.7 10*3/uL (ref 0.7–4.0)
MCHC: 32.2 g/dL (ref 30.0–36.0)
MCV: 80.9 fl (ref 78.0–100.0)
Monocytes Absolute: 0.7 10*3/uL (ref 0.1–1.0)
Neutrophils Relative %: 47.5 % (ref 43.0–77.0)
Platelets: 355 10*3/uL (ref 150.0–400.0)
RBC: 5.46 Mil/uL — ABNORMAL HIGH (ref 3.87–5.11)

## 2011-04-03 LAB — COMPREHENSIVE METABOLIC PANEL
AST: 39 U/L — ABNORMAL HIGH (ref 0–37)
Albumin: 4.1 g/dL (ref 3.5–5.2)
Alkaline Phosphatase: 68 U/L (ref 39–117)
BUN: 23 mg/dL (ref 6–23)
Glucose, Bld: 83 mg/dL (ref 70–99)
Potassium: 4.5 mEq/L (ref 3.5–5.1)
Total Bilirubin: 0.4 mg/dL (ref 0.3–1.2)

## 2011-04-03 LAB — FERRITIN: Ferritin: 110.2 ng/mL (ref 10.0–291.0)

## 2011-04-03 LAB — IBC PANEL
Iron: 65 ug/dL (ref 42–145)
Saturation Ratios: 19 % — ABNORMAL LOW (ref 20.0–50.0)
Transferrin: 245 mg/dL (ref 212.0–360.0)

## 2011-04-03 LAB — HEMOGLOBIN A1C: Hgb A1c MFr Bld: 5.3 % (ref 4.6–6.5)

## 2011-04-03 NOTE — Telephone Encounter (Signed)
Spoke with Corrie Dandy from [facility] regarding Valerie Sweeney, patient having nausea and vomiting, s/p gastric sleeve, Dr. Biagio Quint not available will speak with one of our Bariatic Surgeons and schedule [name] for follow up.  Patient had labs drawn today and we will await the results, also waiting for clarification on either a CT or UGI Order for further evaluation.

## 2011-04-03 NOTE — Progress Notes (Signed)
Subjective:    Patient ID: Valerie Sweeney, female    DOB: Mar 06, 1965, 46 y.o.   MRN: [id number]  Emesis  This is a new problem. The current episode started in the past 7 days. The problem occurs 2 to 4 times per day. The problem has been unchanged. The emesis has an appearance of bile. There has been no fever. Associated symptoms include diarrhea, dizziness and weight loss. Pertinent negatives include no abdominal pain, arthralgias, chest pain, chills, coughing, fever, headaches, myalgias, sweats or URI. She has tried nothing for the symptoms.  Anemia Presents for follow-up visit. Symptoms include anorexia, light-headedness, malaise/fatigue and weight loss. There has been no abdominal pain, bruising/bleeding easily, confusion, fever, leg swelling, pallor, palpitations, paresthesias or pica. Signs of blood loss that are present include vaginal bleeding. Signs of blood loss that are not present include hematemesis, hematochezia, melena and menorrhagia. There are no compliance problems.       Review of Systems  Constitutional: Positive for weight loss, malaise/fatigue, appetite change and fatigue. Negative for fever, chills, diaphoresis, activity change and unexpected weight change.  HENT: Negative.   Eyes: Negative.   Respiratory: Negative for cough, chest tightness, shortness of breath, wheezing and stridor.   Cardiovascular: Negative for chest pain, palpitations and leg swelling.  Gastrointestinal: Positive for nausea, vomiting, diarrhea and anorexia. Negative for abdominal pain, constipation, blood in stool, melena, hematochezia, abdominal distention, anal bleeding, rectal pain and hematemesis.  Genitourinary: Positive for vaginal bleeding. Negative for dysuria, urgency, hematuria, flank pain, decreased urine volume, enuresis, difficulty urinating, dyspareunia and menorrhagia.  Musculoskeletal: Negative for myalgias, back pain, joint swelling, arthralgias and gait problem.  Skin: Negative for  color change, pallor, rash and wound.  Neurological: Positive for dizziness and light-headedness. Negative for tremors, seizures, syncope, facial asymmetry, speech difficulty, weakness, numbness, headaches and paresthesias.  Hematological: Negative for adenopathy. Does not bruise/bleed easily.  Psychiatric/Behavioral: Negative.  Negative for confusion.       Objective:   Physical Exam  Vitals reviewed. Constitutional: She is oriented to person, place, and time. She appears well-developed and well-nourished.  Non-toxic appearance. She does not have a sickly appearance. She does not appear ill. No distress.  HENT:  Head: Normocephalic and atraumatic.  Mouth/Throat: Oropharynx is clear and moist. No oropharyngeal exudate.  Eyes: Conjunctivae are normal. Right eye exhibits no discharge. Left eye exhibits no discharge. No scleral icterus.  Neck: Normal range of motion. Neck supple. No JVD present. No tracheal deviation present. No thyromegaly present.  Cardiovascular: Normal rate, regular rhythm, normal heart sounds and intact distal pulses.  Exam reveals no gallop and no friction rub.   No murmur heard. Pulmonary/Chest: Effort normal and breath sounds normal. No stridor. No respiratory distress. She has no wheezes. She has no rales. She exhibits no tenderness.  Abdominal: Soft. Bowel sounds are normal. She exhibits no distension and no mass. There is no tenderness. There is no rebound and no guarding.  Musculoskeletal: Normal range of motion. She exhibits no edema and no tenderness.  Lymphadenopathy:    She has no cervical adenopathy.  Neurological: She is alert and oriented to person, place, and time.  Skin: Skin is warm and dry. No rash noted. She is not diaphoretic. No erythema. No pallor.  Psychiatric: She has a normal mood and affect. Her behavior is normal. Judgment and thought content normal.      Lab Results  Component Value Date   WBC 10.5 03/22/2011   HGB 11.9* 03/22/2011   HCT  36.9  03/22/2011   PLT 356 03/22/2011   GLUCOSE 117* 03/22/2011   CHOL 164 09/22/2010   TRIG 126.0 09/22/2010   HDL 42.40 09/22/2010   LDLCALC 96 09/22/2010   ALT 30 03/22/2011   AST 18 03/22/2011   NA 132* 03/22/2011   K 3.5 03/22/2011   CL 98 03/22/2011   CREATININE 0.50 03/22/2011   BUN 4* 03/22/2011   CO2 26 03/22/2011   TSH 1.32 09/22/2010      Assessment & Plan:

## 2011-04-03 NOTE — Assessment & Plan Note (Signed)
I will check her a1c today 

## 2011-04-03 NOTE — Assessment & Plan Note (Signed)
She has a low BP today so I asked her to stop taking lisinopril

## 2011-04-03 NOTE — Patient Instructions (Signed)
Nausea and Vomiting  Nausea is a sick feeling that often comes before throwing up (vomiting). Vomiting is a reflex where stomach contents come out of your mouth. Vomiting can cause severe loss of body fluids (dehydration). Children and elderly adults can become dehydrated quickly, especially if they also have diarrhea. Nausea and vomiting are symptoms of a condition or disease. It is important to find the cause of your symptoms.  CAUSES    Direct irritation of the stomach lining. This irritation can result from increased acid production (gastroesophageal reflux disease), infection, food poisoning, taking certain medicines (such as nonsteroidal anti-inflammatory drugs), alcohol use, or tobacco use.   Signals from the brain.These signals could be caused by a headache, heat exposure, an inner ear disturbance, increased pressure in the brain from injury, infection, a tumor, or a concussion, pain, emotional stimulus, or metabolic problems.   An obstruction in the gastrointestinal tract (bowel obstruction).   Illnesses such as diabetes, hepatitis, gallbladder problems, appendicitis, kidney problems, cancer, sepsis, atypical symptoms of a heart attack, or eating disorders.   Medical treatments such as chemotherapy and radiation.   Receiving medicine that makes you sleep (general anesthetic) during surgery.  DIAGNOSIS  Your caregiver may ask for tests to be done if the problems do not improve after a few days. Tests may also be done if symptoms are severe or if the reason for the nausea and vomiting is not clear. Tests may include:   Urine tests.   Blood tests.   Stool tests.   Cultures (to look for evidence of infection).   X-rays or other imaging studies.  Test results can help your caregiver make decisions about treatment or the need for additional tests.  TREATMENT  You need to stay well hydrated. Drink frequently but in small amounts.You may wish to drink water, sports drinks, clear broth, or eat frozen  ice pops or gelatin dessert to help stay hydrated.When you eat, eating slowly may help prevent nausea.There are also some antinausea medicines that may help prevent nausea.  HOME CARE INSTRUCTIONS    Take all medicine as directed by your caregiver.   If you do not have an appetite, do not force yourself to eat. However, you must continue to drink fluids.   If you have an appetite, eat a normal diet unless your caregiver tells you differently.   Eat a variety of complex carbohydrates (rice, wheat, potatoes, bread), lean meats, yogurt, fruits, and vegetables.   Avoid high-fat foods because they are more difficult to digest.   Drink enough water and fluids to keep your urine clear or pale yellow.   If you are dehydrated, ask your caregiver for specific rehydration instructions. Signs of dehydration may include:   Severe thirst.   Dry lips and mouth.   Dizziness.   Dark urine.   Decreasing urine frequency and amount.   Confusion.   Rapid breathing or pulse.  SEEK IMMEDIATE MEDICAL CARE IF:    You have blood or brown flecks (like coffee grounds) in your vomit.   You have black or bloody stools.   You have a severe headache or stiff neck.   You are confused.   You have severe abdominal pain.   You have chest pain or trouble breathing.   You do not urinate at least once every 8 hours.   You develop cold or clammy skin.   You continue to vomit for longer than 24 to 48 hours.   You have a fever.  MAKE SURE YOU:      Understand these instructions.   Will watch your condition.   Will get help right away if you are not doing well or get worse.  Document Released: 12/26/2004 Document Revised: 12/15/2010 Document Reviewed: 05/25/2010  Exit[employer] Patient Information 2012 [employer].

## 2011-04-03 NOTE — Assessment & Plan Note (Signed)
Continue omeprazole 

## 2011-04-03 NOTE — Assessment & Plan Note (Addendum)
We callled Dr. Biagio Quint and informed him that she is experiencing post-op N/V/D, he will manage this with me today, I will check her lytes and renal function today

## 2011-04-03 NOTE — Assessment & Plan Note (Signed)
I will recheck her CBC and  And will also check her iron level

## 2011-04-04 ENCOUNTER — Encounter: Payer: Self-pay | Admitting: *Deleted

## 2011-04-04 ENCOUNTER — Encounter: Payer: BC Managed Care – PPO | Attending: Surgery | Admitting: *Deleted

## 2011-04-04 ENCOUNTER — Telehealth (INDEPENDENT_AMBULATORY_CARE_PROVIDER_SITE_OTHER): Payer: Self-pay

## 2011-04-04 DIAGNOSIS — Z713 Dietary counseling and surveillance: Secondary | ICD-10-CM | POA: Insufficient documentation

## 2011-04-04 DIAGNOSIS — Z01818 Encounter for other preprocedural examination: Secondary | ICD-10-CM | POA: Insufficient documentation

## 2011-04-04 NOTE — Telephone Encounter (Signed)
Rec'd call from Huntley Dec with Bariatric Nutrition, Valerie Sweeney reported having vomiting several day's a week since her Gastrectomy.  Per Huntley Dec patient overall appearance was good and she reports that patient is taking Prilosec which helps and that she was having a lot of problems drinking her protein shakes because she does not like milk and that seemed to be the contributing factor with her vomiting episodes.   Patient will call our office if her symptoms return and will keep her follow up appointment with Dr. Biagio Quint on 04/25/11.    Dr. Biagio Quint will be notified.

## 2011-04-04 NOTE — Telephone Encounter (Signed)
Called patient to follow up with post op care from Gastrectomy, patient not available left voice message to call our office to discuss further. Patient PCP office called stating patient vomiting 3-4 times a week since surgery, having weight loss and acid reflux.  Reviewed with Dr. Biagio Quint and if patient continues to have symptoms order an UGI.  Patient has an appointment today for Nutrition Post Op Management @ 4:00 pm, spoke with Huntley Dec @ Bariatric Nutrition Office and she will give me a call after patient has attended class to discuss her evaluation and Ms. Schermerhorn's dietary & nutritional need's.

## 2011-04-05 ENCOUNTER — Telehealth (INDEPENDENT_AMBULATORY_CARE_PROVIDER_SITE_OTHER): Payer: Self-pay

## 2011-04-05 NOTE — Patient Instructions (Signed)
Patient to follow Phase 3A-Soft, High Protein Diet and follow-up at [facility] in 6 weeks for 2 months post-op nutrition visit for diet advancement. 

## 2011-04-05 NOTE — Progress Notes (Addendum)
  Bariatric Class:  Appt start time: 1600 end time:  1700.  2 Week Post-Operative Nutrition Class  Patient was seen on 04/04/11 for Post-Operative Nutrition education at the [facility].   Surgery date: 03/21/11 Surgery type: Gastric Sleeve  Weight today: 266.3 lbs Weight change: 29.1 lbs Total weight lost: 29.1 lbs BMI:  47.7  The following the learning objective met the patient during this course:   Identifies Phase 3A (Soft, High Proteins) Dietary Goals and will begin from 2 weeks post-operatively to 2 months post-operatively   Identifies appropriate sources of fluids and proteins   States protein recommendations and appropriate sources post-operatively  Identifies the need for appropriate texture modifications, mastication, and bite sizes when consuming solids  Identifies appropriate multivitamin and calcium sources post-operatively  Describes the need for physical activity post-operatively and will follow MD recommendations  States when to call healthcare provider regarding medication questions or post-operative complications  Handouts given during class include:  Phase 3A: Soft, High Protein Diet Handout  Follow-Up Plan: Patient will follow-up at [facility] in 6 weeks for 2 months post-op nutrition visit for diet advancement per MD.

## 2011-04-05 NOTE — Telephone Encounter (Signed)
[facility] @ [place] [address] Yetta Barre office patient (PCP) gave her an update on Valerie Sweeney.  Patient mixing her protein shakes w/milk (which patient dislikes and feel's that was causing nausea/vomiting) starting taking Omeprazole which has reduced acid reflux & nausea/vomting, will increase her fluid intake and call our office if she has an increase in her symptoms or they return.

## 2011-04-06 ENCOUNTER — Encounter (INDEPENDENT_AMBULATORY_CARE_PROVIDER_SITE_OTHER): Payer: Self-pay | Admitting: General Surgery

## 2011-04-06 ENCOUNTER — Ambulatory Visit (INDEPENDENT_AMBULATORY_CARE_PROVIDER_SITE_OTHER): Payer: BC Managed Care – PPO | Admitting: General Surgery

## 2011-04-06 VITALS — BP 128/90 | HR 72 | Temp 97.7°F | Ht 66.0 in | Wt 265.4 lb

## 2011-04-06 DIAGNOSIS — Z5189 Encounter for other specified aftercare: Secondary | ICD-10-CM

## 2011-04-06 DIAGNOSIS — Z4889 Encounter for other specified surgical aftercare: Secondary | ICD-10-CM

## 2011-04-06 NOTE — Progress Notes (Signed)
Subjective:     Patient ID: Valerie Sweeney, female   DOB: 1965/08/29, 46 y.o.   MRN: [id number]  HPI This patient follows up to half weeks status post laparoscopic vertical sleeve gastrectomy for morbid obesity. She has had some issues with vomiting of her protein shakes which was resolved with stopping her milk intake and protein supplements. She has since been eating fine although small amounts. She comes in today for wound check because she has some discomfort in the area of her prior laparoscopic cholecystectomy trocar site in the area of her hernia found at the time of her surgery.She says this hurts when she exercises or when she gets up and down. She gets relief of her symptoms with manual pressure in the area. Review of Systems     Objective:   Physical Exam She looks well and in no acute distress Her abdomen is soft and nontender on exam today her incisions are healing well without sign of infection. In the area of her prior hernia in the upper midline, she has some mild tenderness to palpation but no evidence of incarcerated or strangulated hernia    Assessment:     Status post vertical sleeve gastrectomy I think that her abdominal symptoms are due to due her hernia which we knew intraoperatively. This is likely a recurrent hernia causing her symptoms but there is no evidence of incarceration or strangulation. I explained that I would like to repair this in the future date after she has continued to lose some weight and once her weight plateau certainly would consider fixing this defect durably. Otherwise her nausea and vomiting has improved and she is no longer doing this and she stopped taking her milk and protein supplements. I did recommend that she continue with her protein supplements using a non-milk source such as some flavorless protein supplements that she can mixed in with her other food.    Plan:     She will follow up with me in one month to recheck her labs. I told her  that she could go ahead and take some pills if she needs to swallow her vitamins.

## 2011-04-25 ENCOUNTER — Ambulatory Visit (INDEPENDENT_AMBULATORY_CARE_PROVIDER_SITE_OTHER): Payer: BC Managed Care – PPO | Admitting: General Surgery

## 2011-04-28 ENCOUNTER — Telehealth (INDEPENDENT_AMBULATORY_CARE_PROVIDER_SITE_OTHER): Payer: Self-pay | Admitting: General Surgery

## 2011-04-28 NOTE — Telephone Encounter (Signed)
I attempted to call the patient on her cell phone to see how she is doing.  No answer.  I left a message for her to call me back

## 2011-05-01 ENCOUNTER — Encounter: Payer: Self-pay | Admitting: *Deleted

## 2011-05-01 NOTE — Progress Notes (Signed)
Pt here for [facility] body composition. Is 6 wks s/p gastric sleeve surgery by Dr. Biagio Quint.  Concerned about stall in weight loss over the last month.   [facility]ANITA  BODY COMP RESULTS  05/01/11  %Fat 47.2%  FM (lbs) 123.5  FFM (lbs) 138.5  TBW (lbs) 101.5   Surgery date: 03/21/11    Last weight: 266.3 lbs Weight today: 262.0 lbs Weight change: 4.3 lbs  Total weight lost: 33.4 lbs  BMI: 42.3

## 2011-05-01 NOTE — Progress Notes (Unsigned)
Pt is 2.5 mo s/p gastric sleeve and here for scan on the [facility] Body Composition Analyzer.  She was concerned about her stalled weight loss over the last month.    TANITA  BODY COMP RESULTS  05/01/11   %Fat 47.2%   FM (lbs) 123.5   FFM (lbs) 138.5   TBW (lbs) 101.5    Weight today: 262.0 lbs Last weight: 266.3 lbs (

## 2011-05-02 ENCOUNTER — Ambulatory Visit (INDEPENDENT_AMBULATORY_CARE_PROVIDER_SITE_OTHER): Payer: BC Managed Care – PPO | Admitting: General Surgery

## 2011-05-02 NOTE — Progress Notes (Signed)
Subjective:     Patient ID: Valerie Sweeney, female   DOB: 29-Nov-1965, 46 y.o.   MRN: [id number]  HPI This patient follows up 6 weeks status post metastatic vertical sleeve gastrectomy for morbid obesity. She had some early troubles with some vomiting after her protein shakes. This has resolved and she is taking her protein shakes as well as a high-protein diet. She met with a nutritionist today as well to modify some of her postoperative diet. She says that she is still taking pured foods for the most part. She denies any other nausea and vomiting or dysphagia except for she has had some vomiting over the last 2-3 days but still has the rest of her family and she thinks that this is due to stomach bug since her kids also had the same symptoms. Her main complaint is constipation. She just purchased some dulcolax for relief of this.  She denies any other abdominal pain or reflux.   Review of Systems     Objective:   Physical Exam No distress and nontoxic-appearing Her abdomen is soft and nontender on exam her incisions are well-healed without signs of infection.    Assessment:     Status post laparoscopic vertical sleeve gastrectomy-doing well She actually has done fairly good on her weight loss she is down 31 pounds since her procedure. She has slowed down somewhat over the last couple weeks but still is losing weight.  I recommended that she continue with her protein supplements and multivitamins and her hydration. We will see her back in 6 weeks to reevaluate her and to check some nutrition labs. I recommended that she increase her activity as well to increase her weight loss.    Plan:     We will see her back in 6 weeks or sooner as needed.

## 2011-05-12 ENCOUNTER — Encounter: Payer: Self-pay | Admitting: Internal Medicine

## 2011-05-12 ENCOUNTER — Ambulatory Visit (INDEPENDENT_AMBULATORY_CARE_PROVIDER_SITE_OTHER): Payer: BC Managed Care – PPO | Admitting: Internal Medicine

## 2011-05-12 ENCOUNTER — Other Ambulatory Visit (INDEPENDENT_AMBULATORY_CARE_PROVIDER_SITE_OTHER): Payer: BC Managed Care – PPO

## 2011-05-12 VITALS — BP 130/90 | HR 84 | Temp 98.3°F | Resp 16 | Wt 253.0 lb

## 2011-05-12 DIAGNOSIS — R7401 Elevation of levels of liver transaminase levels: Secondary | ICD-10-CM

## 2011-05-12 DIAGNOSIS — D649 Anemia, unspecified: Secondary | ICD-10-CM

## 2011-05-12 DIAGNOSIS — R7309 Other abnormal glucose: Secondary | ICD-10-CM

## 2011-05-12 DIAGNOSIS — I10 Essential (primary) hypertension: Secondary | ICD-10-CM

## 2011-05-12 DIAGNOSIS — Z23 Encounter for immunization: Secondary | ICD-10-CM

## 2011-05-12 DIAGNOSIS — Z0000 Encounter for general adult medical examination without abnormal findings: Secondary | ICD-10-CM

## 2011-05-12 DIAGNOSIS — M1710 Unilateral primary osteoarthritis, unspecified knee: Secondary | ICD-10-CM

## 2011-05-12 DIAGNOSIS — IMO0002 Reserved for concepts with insufficient information to code with codable children: Secondary | ICD-10-CM

## 2011-05-12 DIAGNOSIS — M179 Osteoarthritis of knee, unspecified: Secondary | ICD-10-CM

## 2011-05-12 LAB — COMPREHENSIVE METABOLIC PANEL
Albumin: 3.8 g/dL (ref 3.5–5.2)
Alkaline Phosphatase: 53 U/L (ref 39–117)
BUN: 12 mg/dL (ref 6–23)
Calcium: 9.2 mg/dL (ref 8.4–10.5)
Chloride: 103 mEq/L (ref 96–112)
Glucose, Bld: 87 mg/dL (ref 70–99)
Potassium: 3.9 mEq/L (ref 3.5–5.1)
Sodium: 141 mEq/L (ref 135–145)
Total Protein: 7.5 g/dL (ref 6.0–8.3)

## 2011-05-12 LAB — CBC WITH DIFFERENTIAL/PLATELET
Basophils Relative: 1 % (ref 0.0–3.0)
Eosinophils Relative: 4 % (ref 0.0–5.0)
Lymphocytes Relative: 34.1 % (ref 12.0–46.0)
MCV: 82.3 fl (ref 78.0–100.0)
Monocytes Relative: 6.9 % (ref 3.0–12.0)
Neutrophils Relative %: 54 % (ref 43.0–77.0)
Platelets: 322 10*3/uL (ref 150.0–400.0)
RBC: 4.71 Mil/uL (ref 3.87–5.11)
WBC: 6.5 10*3/uL (ref 4.5–10.5)

## 2011-05-12 LAB — HEMOGLOBIN A1C: Hgb A1c MFr Bld: 5.2 % (ref 4.6–6.5)

## 2011-05-12 LAB — RUBELLA SCREEN: Rubella: 105.5 IU/mL — ABNORMAL HIGH

## 2011-05-12 NOTE — Patient Instructions (Signed)
Degenerative Arthritis  You have osteoarthritis. This is the wear and tear arthritis that comes with aging. It is also called degenerative arthritis. This is common in people past middle age. It is caused by stress on the joints. The large weight bearing joints of the lower extremities are most often affected. The knees, hips, back, neck, and hands can become painful, swollen, and stiff. This is the most common type of arthritis. It comes on with age, carrying too much weight, or from an injury.  Treatment includes resting the sore joint until the pain and swelling improve. Crutches or a walker may be needed for severe flares. Only take over-the-counter or prescription medicines for pain, discomfort, or fever as directed by your caregiver. Local heat therapy may improve motion. Cortisone shots into the joint are sometimes used to reduce pain and swelling during flares.  Osteoarthritis is usually not crippling and progresses slowly. There are things you can do to decrease pain:  · Avoid high impact activities.  · Exercise regularly.  · Low impact exercises such as walking, biking and swimming help to keep the muscles strong and keep normal joint function.  · Stretching helps to keep your range of motion.  · Lose weight if you are overweight. This reduces joint stress.  In severe cases when you have pain at rest or increasing disability, joint surgery may be helpful. See your caregiver for follow-up treatment as recommended.   SEEK IMMEDIATE MEDICAL CARE IF:   · You have severe joint pain.  · Marked swelling and redness in your joint develops.  · You develop a high fever.  Document Released: 12/26/2004 Document Revised: 12/15/2010 Document Reviewed: 05/28/2006  ExitCare® Patient Information ©2012 [employer].

## 2011-05-14 ENCOUNTER — Encounter: Payer: Self-pay | Admitting: Internal Medicine

## 2011-05-14 NOTE — Assessment & Plan Note (Signed)
I will recheck her CBC today 

## 2011-05-14 NOTE — Progress Notes (Signed)
Subjective:    Patient ID: Valerie Sweeney, female    DOB: 1965/04/05, 46 y.o.   MRN: [id number]  Arthritis Presents for follow-up visit. She complains of pain. She reports no stiffness, joint swelling or joint warmth. The symptoms have been worsening. Affected locations include the left knee and right knee. Her pain is at a severity of 2/10. Pertinent negatives include no diarrhea, dry eyes, dry mouth, dysuria, fatigue, fever, pain at night, pain while resting, rash, Raynaud's syndrome or uveitis.      Review of Systems  Constitutional: Negative for fever, chills, diaphoresis, activity change, appetite change and fatigue.  HENT: Negative.   Eyes: Negative.   Respiratory: Negative for cough, chest tightness, shortness of breath, wheezing and stridor.   Cardiovascular: Negative for chest pain, palpitations and leg swelling.  Gastrointestinal: Negative for nausea, vomiting, abdominal pain, diarrhea and anal bleeding.  Genitourinary: Negative.  Negative for dysuria.  Musculoskeletal: Positive for arthralgias (both knees) and arthritis. Negative for myalgias, back pain, joint swelling, gait problem and stiffness.  Skin: Negative for color change, pallor, rash and wound.  Neurological: Negative.   Hematological: Negative for adenopathy. Does not bruise/bleed easily.  Psychiatric/Behavioral: Negative.        Objective:   Physical Exam  Vitals reviewed. Constitutional: She is oriented to person, place, and time. She appears well-developed and well-nourished. No distress.  HENT:  Head: Normocephalic and atraumatic.  Mouth/Throat: Oropharynx is clear and moist. No oropharyngeal exudate.  Eyes: Conjunctivae are normal. Right eye exhibits no discharge. Left eye exhibits no discharge. No scleral icterus.  Neck: Normal range of motion. Neck supple. No JVD present. No tracheal deviation present. No thyromegaly present.  Cardiovascular: Normal rate, regular rhythm, normal heart sounds and intact  distal pulses.  Exam reveals no gallop and no friction rub.   No murmur heard. Pulmonary/Chest: Effort normal and breath sounds normal. No stridor. No respiratory distress. She has no wheezes. She has no rales. She exhibits no tenderness.  Abdominal: Soft. Bowel sounds are normal. She exhibits no distension and no mass. There is no tenderness. There is no rebound and no guarding.  Musculoskeletal: Normal range of motion. She exhibits no edema and no tenderness.       Right knee: She exhibits swelling (mild crepitance). She exhibits normal range of motion, no effusion, no ecchymosis, no deformity, no laceration, no erythema, normal alignment, no LCL laxity, normal patellar mobility and no bony tenderness. no tenderness found.       Left knee: She exhibits swelling (mild crepitance). She exhibits normal range of motion, no effusion, no ecchymosis, no deformity, no laceration, no erythema, normal alignment, no LCL laxity, normal patellar mobility and no bony tenderness. no tenderness found.  Lymphadenopathy:    She has no cervical adenopathy.  Neurological: She is oriented to person, place, and time. She displays normal reflexes. No cranial nerve deficit. She exhibits normal muscle tone. Coordination normal.  Skin: Skin is warm and dry. No rash noted. She is not diaphoretic. No erythema. No pallor.  Psychiatric: She has a normal mood and affect. Her behavior is normal. Judgment and thought content normal.      Lab Results  Component Value Date   WBC 6.5 05/12/2011   HGB 12.3 05/12/2011   HCT 38.8 05/12/2011   PLT 322.0 05/12/2011   GLUCOSE 87 05/12/2011   CHOL 164 09/22/2010   TRIG 126.0 09/22/2010   HDL 42.40 09/22/2010   LDLCALC 96 09/22/2010   ALT 22 05/12/2011   AST  17 05/12/2011   NA 141 05/12/2011   K 3.9 05/12/2011   CL 103 05/12/2011   CREATININE 0.7 05/12/2011   BUN 12 05/12/2011   CO2 27 05/12/2011   TSH 1.32 09/22/2010   HGBA1C 5.2 05/12/2011      Assessment & Plan:

## 2011-05-14 NOTE — Assessment & Plan Note (Signed)
MMR vaccine and titers today so that she may enter school next week

## 2011-05-14 NOTE — Assessment & Plan Note (Signed)
She will work on her lifestyle modifications 

## 2011-05-14 NOTE — Assessment & Plan Note (Signed)
She was told by her bariatric surgeon that a slight increase in her LFT's was expected, I will recheck her LFT's today

## 2011-05-14 NOTE — Assessment & Plan Note (Signed)
She previously saw ? Ortho MD and now she want to see someone else for a second opinion

## 2011-05-14 NOTE — Assessment & Plan Note (Signed)
I will check her a1c today 

## 2011-05-15 ENCOUNTER — Encounter: Payer: Self-pay | Admitting: Internal Medicine

## 2011-05-15 LAB — RUBEOLA ANTIBODY IGG: Rubeola IgG: 1.12 {ISR} — ABNORMAL HIGH

## 2011-05-15 LAB — MUMPS ANTIBODY, IGG: Mumps IgG: 2.48 {ISR} — ABNORMAL HIGH

## 2011-05-16 ENCOUNTER — Ambulatory Visit: Payer: BC Managed Care – PPO | Admitting: *Deleted

## 2011-05-18 ENCOUNTER — Encounter: Payer: Self-pay | Admitting: *Deleted

## 2011-05-18 ENCOUNTER — Encounter: Payer: BC Managed Care – PPO | Attending: Surgery | Admitting: *Deleted

## 2011-05-18 DIAGNOSIS — Z713 Dietary counseling and surveillance: Secondary | ICD-10-CM | POA: Insufficient documentation

## 2011-05-18 DIAGNOSIS — Z01818 Encounter for other preprocedural examination: Secondary | ICD-10-CM | POA: Insufficient documentation

## 2011-05-18 NOTE — Progress Notes (Signed)
  Follow-up visit:  8 Weeks Post-Operative Gastric Sleeve Surgery  Medical Nutrition Therapy:  Appt start time: 1500 end time:  1530.  Primary concerns today: Post-operative bariatric surgery nutrition management. Valerie Sweeney returns today for 2 mo f/u and reports doing well. Not taking supplements regularly as she "cannot remember". Activity has increased. Valerie Sweeney reports she will be leaving for a 2 month trip to [place] on 06/22/11. Discussed ways to get her protein in while abroad. She cannot take her shakes with her.   Surgery date: 03/21/11    Surgery type: Gastric Sleeve Start weight at [facility]: 295.4 lbs  Last weight: 266.3 lbs Weight today: 249.5 lbs Weight change: 12.5 lbs  Total weight lost: 45.9 lbs  BMI: 40.3  [facility]  BODY COMP RESULTS  05/01/11 05/18/11     %Fat 47.2% 46.9%    FM (lbs) 123.5 117.0    FFM (lbs) 138.5 132.5    TBW (lbs) 101.5 97.0   24-hr recall:  B (AM): Protein shake (30g) Snk (AM): None   L (PM):  Fava beans (3 oz) with feta cheese, little olive oil (15g) Snk (PM): None  D (PM): Meat or chicken (2 oz) - (20g) Snk (PM): None  Fluid intake: ~45-50 oz Estimated total protein intake: 60-65g  Medications: No longer taking blood pressure medication Supplementation: Taking inconsistently; forgets often. Takes all supplements ~2-3 days/wk  Using straws: No  Drinking while eating: No Hair loss: No Carbonated beverages: No N/V/D/C: Some constipation; takes Ex-Lax prn Dumping syndrome: No  Recent physical activity:  Walks on treadmill for 30-45 min 5-6 days/wk; Zumba classes for 60 min 2-3 days/wk  Progress Towards Goal(s):  In progress.  Handouts given during visit include:  Phase 3B: High Protein + Non-Starchy Vegetables   Nutritional Diagnosis:  Valerie Sweeney-3.3 Overweight/obesity related to recent Gastric Sleeve surgery as evidenced by patient following post-op nutrition guidelines for continued weight loss.    Intervention:  Nutrition  education.  Monitoring/Evaluation:  Dietary intake, exercise, and body weight. Follow up in 1 months for 3 month post-op visit.

## 2011-05-18 NOTE — Patient Instructions (Signed)
Goals:  Follow Phase 3B: High Protein + Non-Starchy Vegetables  Eat 3-6 small meals/snacks, every 3-5 hrs  Increase lean protein foods to meet 60-80g goal  Increase fluid intake to 64oz +  Avoid drinking 15 minutes before, during and 30 minutes after eating  Aim for >30 min of physical activity daily  Try QuestBar (www.questproteinbar.com) with 20g protein. :)

## 2011-06-08 ENCOUNTER — Ambulatory Visit (INDEPENDENT_AMBULATORY_CARE_PROVIDER_SITE_OTHER): Payer: BC Managed Care – PPO | Admitting: General Surgery

## 2011-06-08 ENCOUNTER — Encounter (INDEPENDENT_AMBULATORY_CARE_PROVIDER_SITE_OTHER): Payer: Self-pay | Admitting: General Surgery

## 2011-06-08 DIAGNOSIS — K912 Postsurgical malabsorption, not elsewhere classified: Secondary | ICD-10-CM

## 2011-06-08 LAB — IRON AND TIBC
%SAT: 13 % — ABNORMAL LOW (ref 20–55)
Iron: 38 ug/dL — ABNORMAL LOW (ref 42–145)
UIBC: 253 ug/dL (ref 125–400)

## 2011-06-08 LAB — VITAMIN B12: Vitamin B-12: 696 pg/mL (ref 211–911)

## 2011-06-08 NOTE — Progress Notes (Signed)
Subjective:     Patient ID: Valerie Sweeney, female   DOB: 12/23/1965, 46 y.o.   MRN: [id number]  HPI This patient follows up 2 months status post vertical sleeve gastrectomy for morbid obesity. She has been doing very well. She has lost 54 pounds and denies any postoperative pain. She has no nausea or vomiting although she says that she has made some poor food choices recently. She is taking her protein and occasionally taking her vitamins but she is not safe with this. She is exercising at the gym frequently and she was on hypertensive medications preoperatively and she is now off of these medications. She tried to wean herself off of her Prilosec but she has persistent reflux and so she remains taking the Prilosec.  Review of Systems     Objective:   Physical Exam Her incisions are healing well without sign of infection she has no evidence of recurrent epigastric hernia. Her weight is down to 237 and her BMI is down to 40.    Assessment:     She is doing very well postoperatively. There is no evidence of any postoperative complications. She has done excellent from her weight loss and has no food intolerances. I recommended that she continue with her bariatric diet and with her physical activity. She is leaving the country to [place] for 2 months so I will see her back in about 3 months for repeat evaluation. She is okay e-mail me if she has any questions or concerns well she is out of the country. Prior to her leaving, we will check some nutrition labs to ensure that everything looks okay. Her primary physician had ordered some basic labs and these look okay.    Plan:     Nutrition labs and follow up in 3 months.

## 2011-06-09 ENCOUNTER — Ambulatory Visit (INDEPENDENT_AMBULATORY_CARE_PROVIDER_SITE_OTHER): Payer: BC Managed Care – PPO | Admitting: General Surgery

## 2011-06-11 LAB — VITAMIN D 1,25 DIHYDROXY: Vitamin D 1, 25 (OH)2 Total: 55 pg/mL (ref 18–72)

## 2011-06-19 ENCOUNTER — Encounter: Payer: BC Managed Care – PPO | Attending: Surgery | Admitting: *Deleted

## 2011-06-19 ENCOUNTER — Encounter: Payer: Self-pay | Admitting: *Deleted

## 2011-06-19 DIAGNOSIS — Z713 Dietary counseling and surveillance: Secondary | ICD-10-CM | POA: Insufficient documentation

## 2011-06-19 DIAGNOSIS — Z01818 Encounter for other preprocedural examination: Secondary | ICD-10-CM | POA: Insufficient documentation

## 2011-06-19 NOTE — Progress Notes (Addendum)
  Follow-up visit: 12 Weeks Post-Operative Gastric Sleeve Surgery  Medical Nutrition Therapy:  Appt start time: 1500 end time:  1530.  Primary concerns today: Post-operative bariatric surgery nutrition management. Valerie Sweeney returns today for 3 mo f/u and is excited to meet her first goal of 230 lbs. Continues non-compliance with supplements as she cannot tolerate the taste and states she is "eating well".  Discussed trying to find one that meets her dietary needs (religious reasons) while in [place] for the next 2 months.     Surgery date: 03/21/11    Surgery type: Gastric Sleeve [facility] weight at [facility]: 295.4 lbs  Last weight: 249.5 lbs Weight today: 230.0 lbs Weight change: 19.5 lbs  Total weight lost: 65.4 lbs  BMI: 37.1 kg/m^2  Weight loss goal: 170 lbs % goal met: 52%  [facility]ANITA  BODY COMP RESULTS  05/01/11 05/18/11  06/19/11    %Fat 47.2% 46.9% 45.3%    FM (lbs) 123.5 117.0 104.0    FFM (lbs) 138.5 132.5 126.0    TBW (lbs) 101.5 97.0 92.0   24-hr recall: B (AM): Protein shake (30g) Snk (AM): None  L (PM):  Fava beans (3 oz) with feta cheese, little olive oil (15g) Snk (PM): None  D (PM): Meat or chicken (2 oz) - (20g) Snk (PM): None  Fluid intake: ~45-50 oz Estimated total protein intake: 60-65g  Medications: No changes reported. Supplementation: Taking MVI and calcium inconsistently; forgets often and dislikes taste. Reports taking once every 10 days. Taking B12 daily.  Using straws: No  Drinking while eating: No Hair loss: No Carbonated beverages: No N/V/D/C: Some constipation; takes Ex-Lax prn Dumping syndrome: N/A  Recent physical activity:  Continues to walk on treadmill for 30-45 min 5-6 days/wk; Zumba classes for 60 min 2-3 days/wk  Progress Towards Goal(s):  In progress.   Nutritional Diagnosis:  [place]-3.3 Overweight/obesity related to recent Gastric Sleeve surgery as evidenced by patient following post-op nutrition guidelines for continued weight loss.      Intervention:  Nutrition education.  Monitoring/Evaluation:  Dietary intake, exercise, and body weight. Follow up in 3 months for 6 month post-op visit.

## 2011-06-19 NOTE — Patient Instructions (Addendum)
   Continue previous goals...  You've lost 73 lbs since you started this journey!!  WAY TO GO!!  TANITA  BODY COMP RESULTS  05/01/11 05/18/11  06/19/11    %Fat 47.2% 46.9% 45.3%    FM (lbs) 123.5 117.0 104.0    FFM (lbs) 138.5 132.5 126.0    TBW (lbs) 101.5 97.0 92.0

## 2011-08-20 ENCOUNTER — Other Ambulatory Visit: Payer: Self-pay | Admitting: Internal Medicine

## 2011-09-01 ENCOUNTER — Ambulatory Visit (INDEPENDENT_AMBULATORY_CARE_PROVIDER_SITE_OTHER): Payer: BC Managed Care – PPO | Admitting: General Surgery

## 2011-09-19 ENCOUNTER — Encounter: Payer: BC Managed Care – PPO | Attending: Surgery | Admitting: *Deleted

## 2011-09-19 ENCOUNTER — Encounter: Payer: Self-pay | Admitting: *Deleted

## 2011-09-19 DIAGNOSIS — Z01818 Encounter for other preprocedural examination: Secondary | ICD-10-CM | POA: Insufficient documentation

## 2011-09-19 DIAGNOSIS — Z713 Dietary counseling and surveillance: Secondary | ICD-10-CM | POA: Insufficient documentation

## 2011-09-19 NOTE — Progress Notes (Addendum)
  Follow-up visit: 6 Months Post-Operative Gastric Sleeve Surgery  Medical Nutrition Therapy:  Appt start time: 1500 end time:  1530.  Primary concerns today: Post-operative bariatric surgery nutrition management.  Surgery date: 03/21/11    Surgery type: Gastric Sleeve Start weight at [facility]: 295.4 lbs  Last weight: 230 lbs Weight today: 208.0 lbs Weight change:  lbs  Total weight lost: 87.4 lbs  BMI: 33.6 kg/m^2  Weight loss goal: 180 lbs % goal met: 76%  TANITA  BODY COMP RESULTS  05/01/11 05/18/11  06/19/11 09/19/11    %Fat 47.2% 46.9% 45.3% 42.3%    FM (lbs) 123.5 117.0 104.0 88.0    FFM (lbs) 138.5 132.5 126.0 120.0    TBW (lbs) 101.5 97.0 92.0 88.0   24-hr recall: B (AM): Protein shake (30g) Snk (AM): None  L (PM):  Fava beans (3 oz) with feta cheese, little olive oil (15g) Snk (PM): None  D (PM): Meat or chicken (2 oz) with salad - (20g) Snk (PM): None  Fluid intake: ~45-50 oz Estimated total protein intake: 60-65g  Medications: No changes reported. Supplementation: Taking MVI and calcium inconsistently; forgets often and dislikes taste. Reports taking once every 10 days. Taking B12 daily.  Using straws: No  Drinking while eating: No Hair loss: No Carbonated beverages: No N/V/D/C: No  Recent physical activity:  Zumba classes for 60 min 2-3 days/wk  Progress Towards Goal(s):  In progress.  Handouts given during visit:   Phase IV: Low-Fat, Low-Carb Solid Food   Nutritional Diagnosis:  [place]-3.3 Overweight/obesity related to recent Gastric Sleeve surgery as evidenced by patient following post-op nutrition guidelines for continued weight loss.    Intervention:  Nutrition education/reinforcement.  Monitoring/Evaluation:  Dietary intake, exercise, and body weight. Follow up in 6 months for 12 month post-op visit.

## 2011-09-19 NOTE — Patient Instructions (Addendum)
   Continue previous goals...  TANITA  BODY COMP RESULTS  05/01/11 05/18/11  06/19/11 09/19/11    %Fat 47.2% 46.9% 45.3% 42.3%    FM (lbs) 123.5 117.0 104.0 88.0    FFM (lbs) 138.5 132.5 126.0 120.0    TBW (lbs) 101.5 97.0 92.0 88.0

## 2011-09-29 ENCOUNTER — Ambulatory Visit (INDEPENDENT_AMBULATORY_CARE_PROVIDER_SITE_OTHER): Payer: BC Managed Care – PPO | Admitting: General Surgery

## 2011-09-29 ENCOUNTER — Encounter (INDEPENDENT_AMBULATORY_CARE_PROVIDER_SITE_OTHER): Payer: Self-pay | Admitting: General Surgery

## 2011-09-29 VITALS — BP 100/74 | HR 80 | Temp 97.8°F | Ht 66.0 in | Wt 208.6 lb

## 2011-09-29 DIAGNOSIS — K912 Postsurgical malabsorption, not elsewhere classified: Secondary | ICD-10-CM

## 2011-09-29 LAB — COMPREHENSIVE METABOLIC PANEL
ALT: 17 U/L (ref 0–35)
Albumin: 3.8 g/dL (ref 3.5–5.2)
CO2: 27 mEq/L (ref 19–32)
Calcium: 9 mg/dL (ref 8.4–10.5)
Chloride: 104 mEq/L (ref 96–112)
Creat: 0.57 mg/dL (ref 0.50–1.10)
Potassium: 4.8 mEq/L (ref 3.5–5.3)
Sodium: 139 mEq/L (ref 135–145)
Total Protein: 7.1 g/dL (ref 6.0–8.3)

## 2011-09-29 LAB — CBC WITH DIFFERENTIAL/PLATELET
Eosinophils Relative: 6 % — ABNORMAL HIGH (ref 0–5)
HCT: 38.5 % (ref 36.0–46.0)
Lymphocytes Relative: 42 % (ref 12–46)
Lymphs Abs: 3.3 10*3/uL (ref 0.7–4.0)
MCV: 84.2 fL (ref 78.0–100.0)
Platelets: 312 10*3/uL (ref 150–400)
RBC: 4.57 MIL/uL (ref 3.87–5.11)
WBC: 7.7 10*3/uL (ref 4.0–10.5)

## 2011-09-29 LAB — IRON AND TIBC: Iron: 26 ug/dL — ABNORMAL LOW (ref 42–145)

## 2011-09-29 NOTE — Progress Notes (Signed)
Subjective:     Patient ID: Valerie Sweeney, female   DOB: [birth year], 46 y.o.   MRN: [id number]  HPI This patient follows up in 6 months status post laparoscopic vertical sleeve gastrectomy. She is doing well without any complaints. She says that she is exercising 3 times a day and has lost about 83 pounds since her procedure. She has a goal of another 30 pounds of weight loss. She is taking her protein supplements and vitamins and has no food intolerance. She has some occasional discomfort in the area of her hernia but no other complaints.  Review of Systems     Objective:   Physical Exam No distress and nontoxic-appearing Her abdomen is soft and nontender on exam I can appreciate a reducible epigastric hernia. Her incisions are otherwise healed well    Assessment:    Morbid obesity and malnutrition She seems to be doing very well from her procedure an infected weight loss to this point. She would like to lose another 30 pounds which I think is doable for her. I recommended that she increase her physical activities and be very careful as to the foods that she eats especially since she says that she is regaining some of her old cravings. There is no evidence of any obvious vitamin deficiencies but we will go ahead and check some nutrition labs today in otherwise I will see her back in 6 months    Plan:     Check labs and f/u in 6 months

## 2011-10-03 LAB — VITAMIN D 1,25 DIHYDROXY: Vitamin D2 1, 25 (OH)2: <8 pg/mL

## 2011-10-14 ENCOUNTER — Other Ambulatory Visit: Payer: Self-pay | Admitting: Internal Medicine

## 2011-10-21 ENCOUNTER — Other Ambulatory Visit: Payer: Self-pay | Admitting: Internal Medicine

## 2012-03-18 ENCOUNTER — Encounter: Payer: BC Managed Care – PPO | Attending: General Surgery | Admitting: *Deleted

## 2012-03-18 ENCOUNTER — Ambulatory Visit: Payer: BC Managed Care – PPO | Admitting: *Deleted

## 2012-03-18 ENCOUNTER — Encounter: Payer: Self-pay | Admitting: *Deleted

## 2012-03-18 DIAGNOSIS — Z713 Dietary counseling and surveillance: Secondary | ICD-10-CM | POA: Insufficient documentation

## 2012-03-18 NOTE — Progress Notes (Signed)
  Follow-up visit:  12 Months Post-Operative Gastric Sleeve Surgery  Medical Nutrition Therapy:  Appt start time: 1145 end time:  1215.  Primary concerns today: Post-operative bariatric surgery nutrition management.  Surgery date: 03/21/11    Surgery type: Gastric Sleeve Start weight at [facility]: 295.4 lbs  Weight today: 206.3 lbs Weight change: 1.7 lbs  Total weight lost: 89.1 lbs  BMI: 33.3 kg/m^2  Weight loss goal: 180 lbs % goal met: 77%  *NOTE: TANITA BROKEN DAY OF VISIT  TANITA  BODY COMP RESULTS  05/01/11 05/18/11  06/19/11 09/19/11    %Fat 47.2% 46.9% 45.3% 42.3%    FM (lbs) 123.5 117.0 104.0 88.0    FFM (lbs) 138.5 132.5 126.0 120.0    TBW (lbs) 101.5 97.0 92.0 88.0   Fluid intake: ~45-50 oz Estimated total protein intake: 60-65g  Medications: No changes reported. Supplementation: Taking MVI and calcium inconsistently. Taking B12 daily.  Using straws: No  Drinking while eating: No Hair loss: No Carbonated beverages: No N/V/D/C: No Dumping: None  Recent physical activity:  None since January d/t hard semester   Progress Towards Goal(s):  In progress.  Samples given during visit:   [place] MVI: 3 bottles @ 5 tabs/bottle Lot # [id number]; Exp: 03/17  Nutritional Diagnosis:  [place]-3.3 Overweight/obesity related to recent Gastric Sleeve surgery as evidenced by patient following post-op nutrition guidelines for continued weight loss.    Intervention:  Nutrition education/reinforcement.  Monitoring/Evaluation:  Dietary intake, exercise, and body weight. Follow up in 6 months for 18 month post-op visit.

## 2012-03-18 NOTE — Patient Instructions (Addendum)
   Continue previous goals.  Return in 6 mos or as needed.

## 2012-03-19 ENCOUNTER — Ambulatory Visit: Payer: Self-pay | Admitting: *Deleted

## 2012-04-25 ENCOUNTER — Ambulatory Visit (INDEPENDENT_AMBULATORY_CARE_PROVIDER_SITE_OTHER): Payer: BC Managed Care – PPO | Admitting: Internal Medicine

## 2012-04-25 ENCOUNTER — Encounter: Payer: Self-pay | Admitting: Internal Medicine

## 2012-04-25 VITALS — BP 120/80 | HR 78 | Temp 98.0°F | Resp 12 | Ht 66.0 in | Wt 209.4 lb

## 2012-04-25 DIAGNOSIS — I10 Essential (primary) hypertension: Secondary | ICD-10-CM

## 2012-04-25 DIAGNOSIS — L03111 Cellulitis of right axilla: Secondary | ICD-10-CM

## 2012-04-25 DIAGNOSIS — Z309 Encounter for contraceptive management, unspecified: Secondary | ICD-10-CM | POA: Insufficient documentation

## 2012-04-25 DIAGNOSIS — IMO0002 Reserved for concepts with insufficient information to code with codable children: Secondary | ICD-10-CM

## 2012-04-25 MED ORDER — SULFAMETHOXAZOLE-TRIMETHOPRIM 800-160 MG PO TABS: 1.0000 | ORAL_TABLET | Freq: Two times a day (BID) | ORAL | Status: DC

## 2012-04-25 NOTE — Progress Notes (Signed)
  Subjective:    Patient ID: Valerie Sweeney, female    DOB: 03-31-1965, 47 y.o.   MRN: [id number]  HPI  She complains of a small painful lump in her right axilla for 2 days.  Review of Systems  Constitutional: Negative.  Negative for fever, chills and fatigue.  HENT: Negative.   Eyes: Negative.   Respiratory: Negative.   Cardiovascular: Negative.   Gastrointestinal: Negative.   Endocrine: Negative.   Genitourinary: Negative.   Musculoskeletal: Negative.   Skin: Negative for color change, pallor, rash and wound.  Allergic/Immunologic: Negative.   Neurological: Negative.   Hematological: Negative.   Psychiatric/Behavioral: Negative.        Objective:   Physical Exam  Vitals reviewed. Constitutional: She is oriented to person, place, and time. She appears well-developed and well-nourished. No distress.  HENT:  Mouth/Throat: No oropharyngeal exudate.  Eyes: Conjunctivae are normal. Right eye exhibits no discharge. Left eye exhibits no discharge. No scleral icterus.  Neck: Normal range of motion. Neck supple. No JVD present. No tracheal deviation present. No thyromegaly present.  Cardiovascular: Normal rate, regular rhythm, normal heart sounds and intact distal pulses.  Exam reveals no gallop and no friction rub.   No murmur heard. Pulmonary/Chest: Breath sounds normal. No stridor. No respiratory distress. She has no wheezes. She has no rales. She exhibits no tenderness.  Abdominal: Soft. Bowel sounds are normal. She exhibits no distension and no mass. There is no tenderness. There is no rebound and no guarding.  Musculoskeletal: Normal range of motion. She exhibits no edema and no tenderness.  Lymphadenopathy:    She has no cervical adenopathy.    She has no axillary adenopathy.       Right axillary: No pectoral and no lateral adenopathy present.       Left axillary: No pectoral and no lateral adenopathy present.      Right: No supraclavicular adenopathy present.       Left:  No supraclavicular adenopathy present.  Neurological: She is oriented to person, place, and time.  Skin: Skin is warm and dry. Lesion noted. No abrasion, no bruising, no burn, no ecchymosis, no laceration, no petechiae, no purpura and no rash noted. Rash is not macular, not papular, not maculopapular, not nodular, not pustular, not vesicular and not urticarial. She is not diaphoretic. No cyanosis or erythema. No pallor. Nails show no clubbing.  In the right medial axilla there is a 0.5 cm soft SQ lesion that is freely mobile, there is no exudate/warmth/induration/pore/erythema/ttp  Psychiatric: She has a normal mood and affect. Her behavior is normal. Judgment and thought content normal.      Lab Results  Component Value Date   WBC 7.7 09/29/2011   HGB 12.2 09/29/2011   HCT 38.5 09/29/2011   PLT 312 09/29/2011   GLUCOSE 78 09/29/2011   CHOL 164 09/22/2010   TRIG 126.0 09/22/2010   HDL 42.40 09/22/2010   LDLCALC 96 09/22/2010   ALT 17 09/29/2011   AST 17 09/29/2011   NA 139 09/29/2011   K 4.8 09/29/2011   CL 104 09/29/2011   CREATININE 0.57 09/29/2011   BUN 11 09/29/2011   CO2 27 09/29/2011   TSH 1.32 09/22/2010   HGBA1C 5.2 05/12/2011      Assessment & Plan:

## 2012-04-25 NOTE — Assessment & Plan Note (Signed)
Gyn referral

## 2012-04-25 NOTE — Patient Instructions (Signed)
Cellulitis Cellulitis is an infection of the skin and the tissue beneath it. The infected area is usually red and tender. Cellulitis occurs most often in the arms and lower legs.   CAUSES   Cellulitis is caused by bacteria that enter the skin through cracks or cuts in the skin. The most common types of bacteria that cause cellulitis are Staphylococcus and Streptococcus. SYMPTOMS    Redness and warmth.   Swelling.   Tenderness or pain.   Fever.  DIAGNOSIS  Your caregiver can usually determine what is wrong based on a physical exam. Blood tests may also be done. TREATMENT   Treatment usually involves taking an antibiotic medicine. HOME CARE INSTRUCTIONS    Take your antibiotics as directed. Finish them even if you start to feel better.   Keep the infected arm or leg elevated to reduce swelling.   Apply a warm cloth to the affected area up to 4 times per day to relieve pain.   Only take over-the-counter or prescription medicines for pain, discomfort, or fever as directed by your caregiver.   Keep all follow-up appointments as directed by your caregiver.  SEEK MEDICAL CARE IF:    You notice red streaks coming from the infected area.   Your red area gets larger or turns dark in color.   Your bone or joint underneath the infected area becomes painful after the skin has healed.   Your infection returns in the same area or another area.   You notice a swollen bump in the infected area.   You develop new symptoms.  SEEK IMMEDIATE MEDICAL CARE IF:    You have a fever.   You feel very sleepy.   You develop vomiting or diarrhea.   You have a general ill feeling (malaise) with muscle aches and pains.  MAKE SURE YOU:    Understand these instructions.   Will watch your condition.   Will get help right away if you are not doing well or get worse.  Document Released: 10/05/2004 Document Revised: 06/27/2011 Document Reviewed: 03/13/2011 [employer] Patient Information 2013  [employer].    

## 2012-04-25 NOTE — Assessment & Plan Note (Signed)
Start bactrim-ds

## 2012-04-25 NOTE — Assessment & Plan Note (Signed)
Her BP is well controlled 

## 2012-04-30 ENCOUNTER — Encounter: Payer: Self-pay | Admitting: Obstetrics & Gynecology

## 2012-05-23 ENCOUNTER — Encounter: Payer: BC Managed Care – PPO | Admitting: Obstetrics & Gynecology

## 2012-07-18 ENCOUNTER — Other Ambulatory Visit: Payer: Self-pay | Admitting: Obstetrics and Gynecology

## 2012-08-02 ENCOUNTER — Other Ambulatory Visit: Payer: Self-pay | Admitting: Internal Medicine

## 2012-08-28 ENCOUNTER — Other Ambulatory Visit: Payer: Self-pay | Admitting: Internal Medicine

## 2012-09-18 ENCOUNTER — Ambulatory Visit: Payer: BC Managed Care – PPO | Admitting: *Deleted

## 2012-10-21 ENCOUNTER — Other Ambulatory Visit: Payer: Self-pay | Admitting: Obstetrics and Gynecology

## 2012-10-21 DIAGNOSIS — Z1231 Encounter for screening mammogram for malignant neoplasm of breast: Secondary | ICD-10-CM

## 2012-11-06 ENCOUNTER — Ambulatory Visit (HOSPITAL_COMMUNITY): Payer: BC Managed Care – PPO | Attending: Obstetrics and Gynecology

## 2012-12-06 ENCOUNTER — Telehealth: Payer: Self-pay

## 2012-12-06 NOTE — Telephone Encounter (Signed)
Received fax from pharmacy stating that insurance [employer] will not cover omeprazole   w/o a prior auth. Need to  call [phone]  to proceed with request (ID# [id number] ). PA initiated via covermymeds, information completed and fax to plan, approval pending insurance discision.

## 2013-03-10 ENCOUNTER — Ambulatory Visit: Payer: BC Managed Care – PPO | Admitting: Family Medicine

## 2013-07-31 ENCOUNTER — Other Ambulatory Visit: Payer: Self-pay | Admitting: Internal Medicine

## 2013-09-08 ENCOUNTER — Other Ambulatory Visit: Payer: Self-pay | Admitting: Obstetrics and Gynecology

## 2013-09-08 DIAGNOSIS — Z1231 Encounter for screening mammogram for malignant neoplasm of breast: Secondary | ICD-10-CM

## 2013-09-11 ENCOUNTER — Ambulatory Visit (HOSPITAL_COMMUNITY): Payer: BC Managed Care – PPO

## 2013-09-11 ENCOUNTER — Ambulatory Visit (HOSPITAL_COMMUNITY)
Admission: RE | Admit: 2013-09-11 | Discharge: 2013-09-11 | Disposition: A | Discharge status: Home / Self Care | Payer: BC Managed Care – PPO | Source: Ambulatory Visit | Attending: Obstetrics and Gynecology | Admitting: Obstetrics and Gynecology

## 2013-09-11 DIAGNOSIS — Z1231 Encounter for screening mammogram for malignant neoplasm of breast: Secondary | ICD-10-CM | POA: Diagnosis present

## 2013-09-25 IMAGING — CR DG CHEST 2V
2 series · 2 of 2 positions shown · non-contrast
Comparison: None.

CLINICAL DATA: Preop gastric sleeve surgery.  Hypertension.

CHEST - 2 VIEW

[w chest pa]
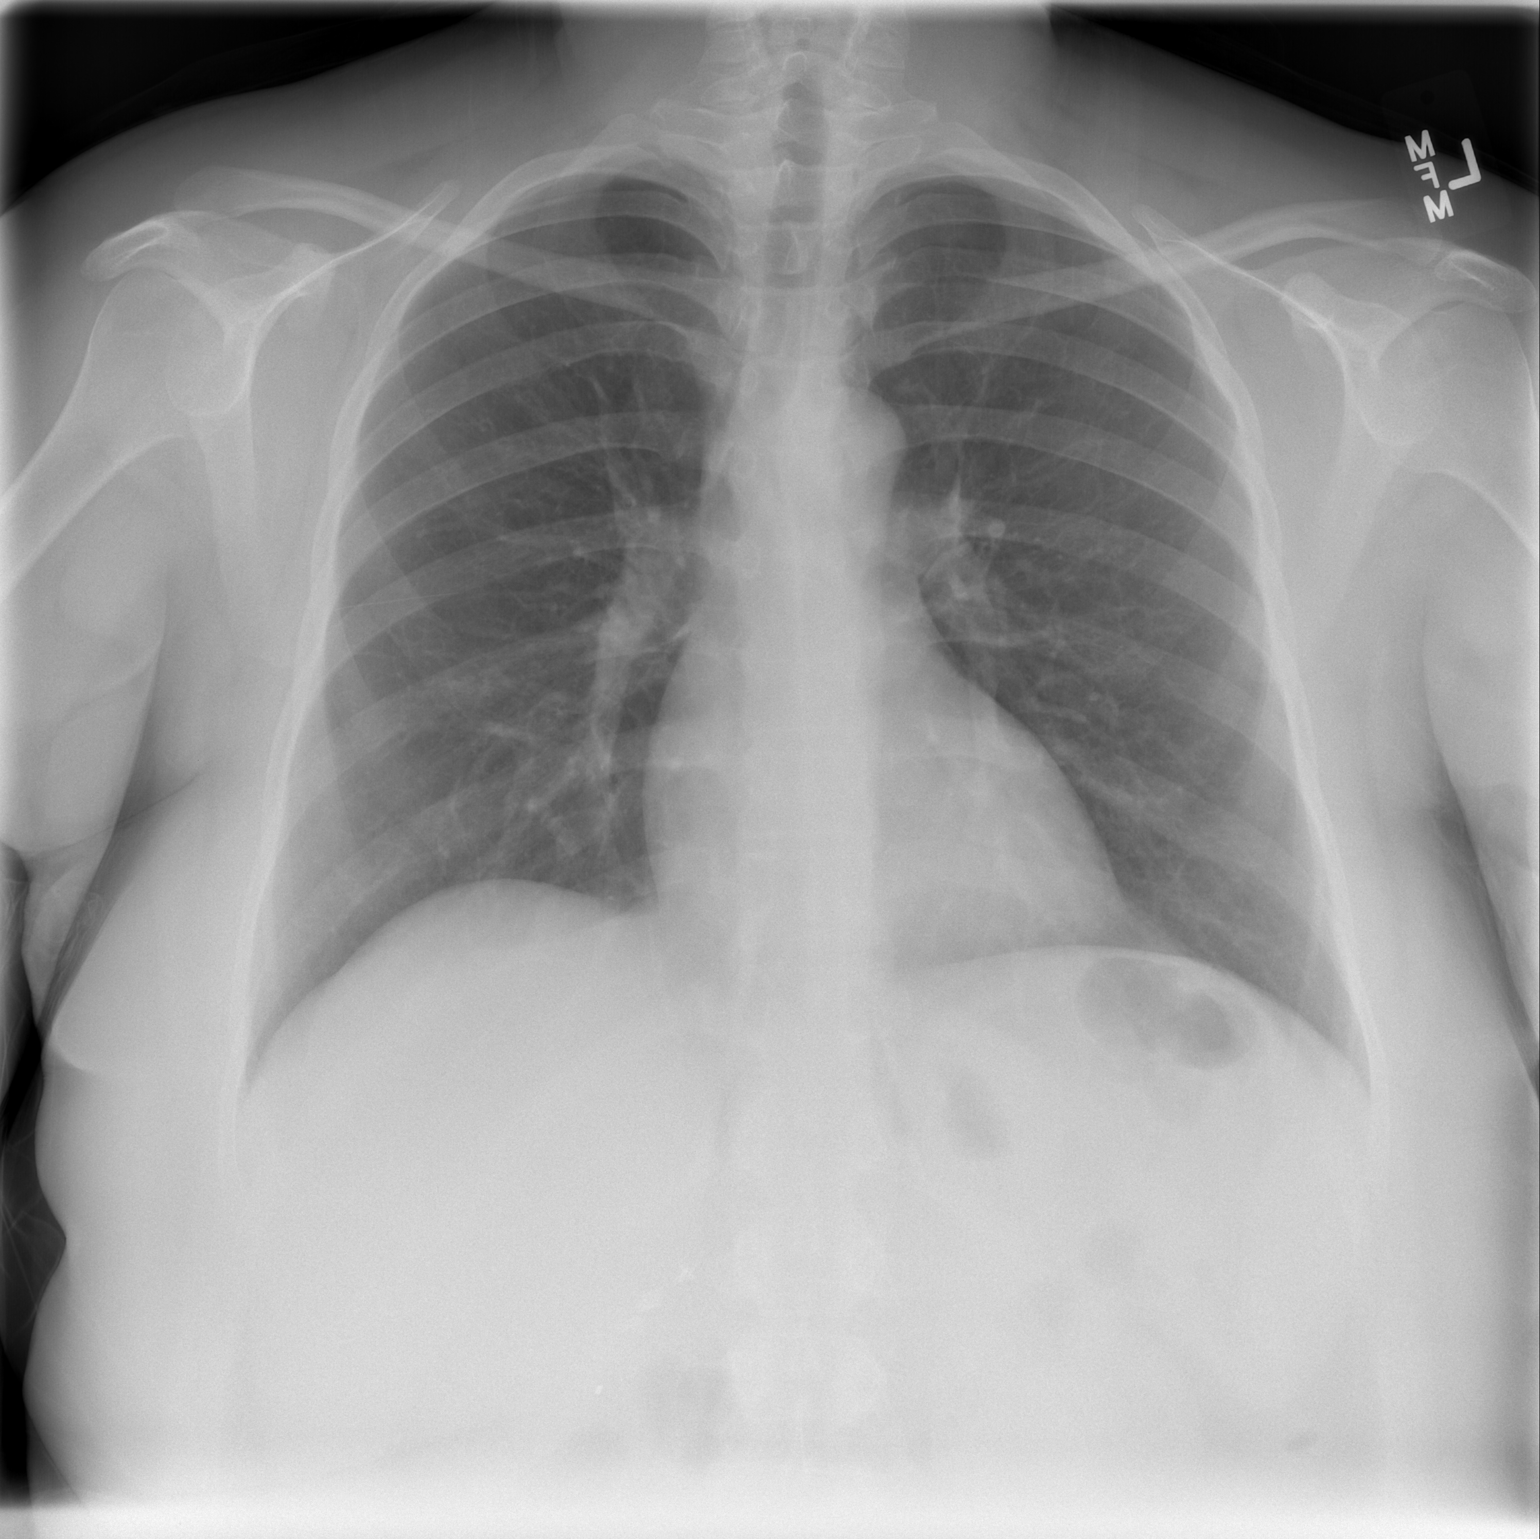

[w chest lat]
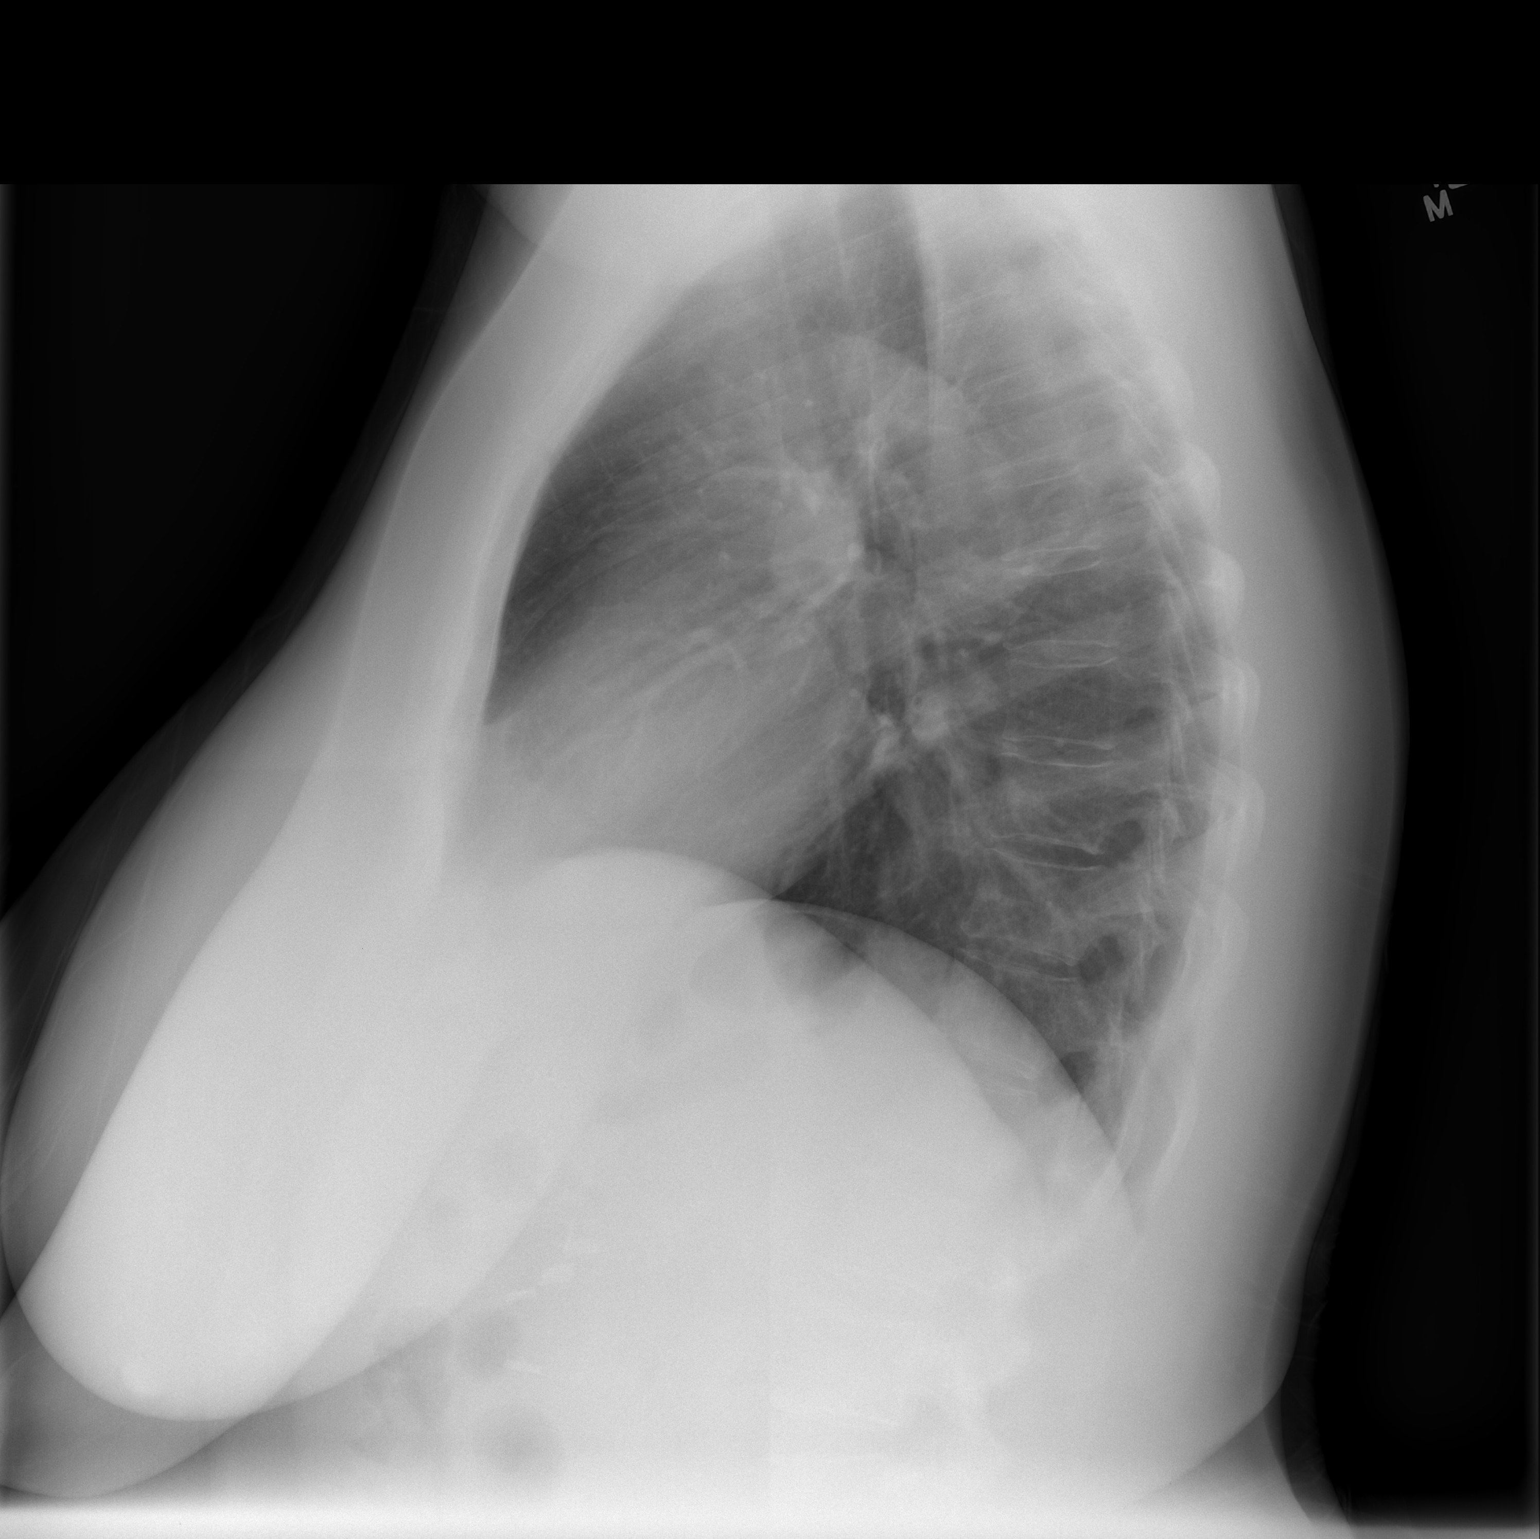

[2 of 2 positions shown; findings below may reference images not displayed]

FINDINGS: Heart and mediastinal contours are within normal limits.
No focal opacities or effusions.  No acute bony abnormality.
IMPRESSION: No active cardiopulmonary disease.

## 2013-11-26 ENCOUNTER — Encounter (HOSPITAL_COMMUNITY): Payer: Self-pay

## 2013-11-26 ENCOUNTER — Emergency Department (HOSPITAL_COMMUNITY)
Admission: EM | Admit: 2013-11-26 | Discharge: 2013-11-26 | Disposition: A | Discharge status: Home / Self Care | Payer: BC Managed Care – PPO | Source: Home / Self Care | Attending: Family Medicine | Admitting: Family Medicine

## 2013-11-26 DIAGNOSIS — H8110 Benign paroxysmal vertigo, unspecified ear: Secondary | ICD-10-CM

## 2013-11-26 DIAGNOSIS — R5383 Other fatigue: Secondary | ICD-10-CM

## 2013-11-26 DIAGNOSIS — H6502 Acute serous otitis media, left ear: Secondary | ICD-10-CM

## 2013-11-26 DIAGNOSIS — IMO0002 Reserved for concepts with insufficient information to code with codable children: Secondary | ICD-10-CM

## 2013-11-26 MED ORDER — FLUTICASONE PROPIONATE 50 MCG/ACT NA SUSP: 2.0000 | Freq: Two times a day (BID) | NASAL | Status: DC

## 2013-11-26 MED ORDER — MECLIZINE HCL 25 MG PO TABS: 25.0000 mg | ORAL_TABLET | Freq: Four times a day (QID) | ORAL | Status: DC | PRN

## 2013-11-26 MED ORDER — PREDNISONE 10 MG PO TABS: ORAL_TABLET | ORAL | Status: DC

## 2013-11-26 NOTE — Discharge Instructions (Signed)
Benign Positional Vertigo Vertigo means you feel like you or your surroundings are moving when they are not. Benign positional vertigo is the most common form of vertigo. Benign means that the cause of your condition is not serious. Benign positional vertigo is more common in older adults. CAUSES  Benign positional vertigo is the result of an upset in the labyrinth system. This is an area in the middle ear that helps control your balance. This may be caused by a viral infection, head injury, or repetitive motion. However, often no specific cause is found. SYMPTOMS  Symptoms of benign positional vertigo occur when you move your head or eyes in different directions. Some of the symptoms may include:  Loss of balance and falls.  Vomiting.  Blurred vision.  Dizziness.  Nausea.  Involuntary eye movements (nystagmus). DIAGNOSIS  Benign positional vertigo is usually diagnosed by physical exam. If the specific cause of your benign positional vertigo is unknown, your caregiver may perform imaging tests, such as magnetic resonance imaging (MRI) or computed tomography (CT). TREATMENT  Your caregiver may recommend movements or procedures to correct the benign positional vertigo. Medicines such as meclizine, benzodiazepines, and medicines for nausea may be used to treat your symptoms. In rare cases, if your symptoms are caused by certain conditions that affect the inner ear, you may need surgery. HOME CARE INSTRUCTIONS   Follow your caregiver's instructions.  Move slowly. Do not make sudden body or head movements.  Avoid driving.  Avoid operating heavy machinery.  Avoid performing any tasks that would be dangerous to you or others during a vertigo episode.  Drink enough fluids to keep your urine clear or pale yellow. SEEK IMMEDIATE MEDICAL CARE IF:   You develop problems with walking, weakness, numbness, or using your arms, hands, or legs.  You have difficulty speaking.  You develop  severe headaches.  Your nausea or vomiting continues or gets worse.  You develop visual changes.  Your family or friends notice any behavioral changes.  Your condition gets worse.  You have a fever.  You develop a stiff neck or sensitivity to light. MAKE SURE YOU:   Understand these instructions.  Will watch your condition.  Will get help right away if you are not doing well or get worse. Document Released: 10/03/2005 Document Revised: 03/20/2011 Document Reviewed: 09/15/2010 [facility] Patient Information 2015 [place], [place]. This information is not intended to replace advice given to you by your health care provider. Make sure you discuss any questions you have with your health care provider.  Serous Otitis Media Serous otitis media is fluid in the middle ear space. This space contains the bones for hearing and air. Air in the middle ear space helps to transmit sound.  The air gets there through the eustachian tube. This tube goes from the back of the nose (nasopharynx) to the middle ear space. It keeps the pressure in the middle ear the same as the outside world. It also helps to drain fluid from the middle ear space. CAUSES  Serous otitis media occurs when the eustachian tube gets blocked. Blockage can come from:  Ear infections.  Colds and other upper respiratory infections.  Allergies.  Irritants such as cigarette smoke.  Sudden changes in air pressure (such as descending in an airplane).  Enlarged adenoids.  A mass in the nasopharynx. During colds and upper respiratory infections, the middle ear space can become temporarily filled with fluid. This can happen after an ear infection also. Once the infection clears, the fluid  will generally drain out of the ear through the eustachian tube. If it does not, then serous otitis media occurs. SIGNS AND SYMPTOMS   Hearing loss.  A feeling of fullness in the ear, without pain.  Young children may not show any symptoms but  may show slight behavioral changes, such as agitation, ear pulling, or crying. DIAGNOSIS  Serous otitis media is diagnosed by an ear exam. Tests may be done to check on the movement of the eardrum. Hearing exams may also be done. TREATMENT  The fluid most often goes away without treatment. If allergy is the cause, allergy treatment may be helpful. Fluid that persists for several months may require minor surgery. A small tube is placed in the eardrum to:  Drain the fluid.  Restore the air in the middle ear space. In certain situations, antibiotic medicines are used to avoid surgery. Surgery may be done to remove enlarged adenoids (if this is the cause). HOME CARE INSTRUCTIONS   Keep children away from tobacco smoke.  Keep all follow-up visits as directed by your health care provider. SEEK MEDICAL CARE IF:   Your hearing is not better in 3 months.  Your hearing is worse.  You have ear pain.  You have drainage from the ear.  You have dizziness.  You have serous otitis media only in one ear or have any bleeding from your nose (epistaxis).  You notice a lump on your neck. MAKE SURE YOU:  Understand these instructions.   Will watch your condition.   Will get help right away if you are not doing well or get worse.  Document Released: 03/18/2003 Document Revised: 05/12/2013 Document Reviewed: 07/23/2012 [facility]Exit[facility] Patient Information 2015 [place], [place]. This information is not intended to replace advice given to you by your health care provider. Make sure you discuss any questions you have with your health care provider.  Fatigue Fatigue is a feeling of tiredness, lack of energy, lack of motivation, or feeling tired all the time. Having enough rest, good nutrition, and reducing stress will normally reduce fatigue. Consult your caregiver if it persists. The nature of your fatigue will help your caregiver to find out its cause. The treatment is based on the cause.  CAUSES  There  are many causes for fatigue. Most of the time, fatigue can be traced to one or more of your habits or routines. Most causes fit into one or more of three general areas. They are: Lifestyle problems  Sleep disturbances.  Overwork.  Physical exertion.  Unhealthy habits.  Poor eating habits or eating disorders.  Alcohol and/or drug use .  Lack of proper nutrition (malnutrition). Psychological problems  Stress and/or anxiety problems.  Depression.  Grief.  Boredom. Medical Problems or Conditions  Anemia.  Pregnancy.  Thyroid gland problems.  Recovery from major surgery.  Continuous pain.  Emphysema or asthma that is not well controlled  Allergic conditions.  Diabetes.  Infections (such as mononucleosis).  Obesity.  Sleep disorders, such as sleep apnea.  Heart failure or other heart-related problems.  Cancer.  Kidney disease.  Liver disease.  Effects of certain medicines such as antihistamines, cough and cold remedies, prescription pain medicines, heart and blood pressure medicines, drugs used for treatment of cancer, and some antidepressants. SYMPTOMS  The symptoms of fatigue include:   Lack of energy.  Lack of drive (motivation).  Drowsiness.  Feeling of indifference to the surroundings. DIAGNOSIS  The details of how you feel help guide your caregiver in finding out what is  causing the fatigue. You will be asked about your present and past health condition. It is important to review all medicines that you take, including prescription and non-prescription items. A thorough exam will be done. You will be questioned about your feelings, habits, and normal lifestyle. Your caregiver may suggest blood tests, urine tests, or other tests to look for common medical causes of fatigue.  TREATMENT  Fatigue is treated by correcting the underlying cause. For example, if you have continuous pain or depression, treating these causes will improve how you feel.  Similarly, adjusting the dose of certain medicines will help in reducing fatigue.  HOME CARE INSTRUCTIONS   Try to get the required amount of good sleep every night.  Eat a healthy and nutritious diet, and drink enough water throughout the day.  Practice ways of relaxing (including yoga or meditation).  Exercise regularly.  Make plans to change situations that cause stress. Act on those plans so that stresses decrease over time. Keep your work and personal routine reasonable.  Avoid street drugs and minimize use of alcohol.  Start taking a daily multivitamin after consulting your caregiver. SEEK MEDICAL CARE IF:   You have persistent tiredness, which cannot be accounted for.  You have fever.  You have unintentional weight loss.  You have headaches.  You have disturbed sleep throughout the night.  You are feeling sad.  You have constipation.  You have dry skin.  You have gained weight.  You are taking any new or different medicines that you suspect are causing fatigue.  You are unable to sleep at night.  You develop any unusual swelling of your legs or other parts of your body. SEEK IMMEDIATE MEDICAL CARE IF:   You are feeling confused.  Your vision is blurred.  You feel faint or pass out.  You develop severe headache.  You develop severe abdominal, pelvic, or back pain.  You develop chest pain, shortness of breath, or an irregular or fast heartbeat.  You are unable to pass a normal amount of urine.  You develop abnormal bleeding such as bleeding from the rectum or you vomit blood.  You have thoughts about harming yourself or committing suicide.  You are worried that you might harm someone else. MAKE SURE YOU:   Understand these instructions.  Will watch your condition.  Will get help right away if you are not doing well or get worse. Document Released: 10/23/2006 Document Revised: 03/20/2011 Document Reviewed: 04/29/2013 [facility] Patient  Information 2015 [place], [place]. This information is not intended to replace advice given to you by your health care provider. Make sure you discuss any questions you have with your health care provider.

## 2013-11-26 NOTE — ED Notes (Signed)
C/o 2-3 day duration of general fatigue, body aches, hot/cold flashes , dizzy. Daughter also ill w similar symptoms. NAD

## 2013-11-26 NOTE — ED Provider Notes (Signed)
CSN: [id number]     Arrival date & time 11/26/13  1712 History   First MD Initiated Contact with Patient 11/26/13 1729     Chief Complaint  Patient presents with  . Fatigue   (Consider location/radiation/quality/duration/timing/severity/associated sxs/prior Treatment) HPI       48 year old female presents complaining of general fatigue, soreness around her left ear, mild headache, and occasional vertigo. Her symptoms initially began this weekend, 4 days ago. They have been constant since that time. She also admits to very mild sore throat.She denies any other specific areas of pain. No fever, chills, NVD, cough,rash. No history of thyroid problems. No dark stools. No history of anemia. Her daughter has a cold right now as well.  Past Medical History  Diagnosis Date  . Hypertension   . GERD (gastroesophageal reflux disease)   . Allergy     SEASONAL ALLERGIES  . Anemia     TAKES IRON AND STATES ALWAYS BEEN ANEMIC  . Knee pain     MORE DIFFICULT TO GET UP AND DOWN--RELATES TO HER WEIGHT   Past Surgical History  Procedure Laterality Date  . Cholecystectomy    . Breath tek h pylori  01/24/2011    Procedure: BREATH TEK H PYLORI;  Surgeon: Kandis Cockingavid H Newman, MD;  Location: [facility];  Service: General;  Laterality: N/A;  . Gastric sleeve  2013   Family History  Problem Relation Age of Onset  . Diabetes Mother   . Stroke Father   . Hypertension Father   . Cancer Neg Hx    History  Substance Use Topics  . Smoking status: Never Smoker   . Smokeless tobacco: Never Used  . Alcohol Use: No   OB History    No data available     Review of Systems  Constitutional: Positive for fatigue.  HENT: Positive for ear pain and sore throat.   Neurological: Positive for dizziness and headaches.  All other systems reviewed and are negative.   Allergies  Pork-derived products  Home Medications   Prior to Admission medications   Medication Sig Start Date End Date Taking? Authorizing  Provider  Calcium-Vitamin A-Vitamin D (LIQUID CALCIUM PO) Take by mouth. 1500mg  daily   Yes Historical Provider, MD  cyanocobalamin 500 MCG tablet Take 500 mcg by mouth daily.   Yes Historical Provider, MD  ferrous sulfate 325 (65 FE) MG tablet TAKE 1 TABLET BY MOUTH DAILY. 08/28/12  Yes Etta Grandchildhomas L Jones, MD  Multiple Vitamin (MULITIVITAMIN WITH MINERALS) TABS Take 2 tablets by mouth daily.    Yes Historical Provider, MD  norethindrone-ethinyl estradiol (JUNEL FE,GILDESS FE,LOESTRIN FE) 1-20 MG-MCG tablet Take 1 tablet by mouth daily.   Yes Historical Provider, MD  omeprazole (PRILOSEC) 20 MG capsule TAKE 1 CAPSULE BY MOUTH DAILY. 10/14/11  Yes Etta Grandchildhomas L Jones, MD  fluticasone (FLONASE) 50 MCG/ACT nasal spray Place 2 sprays into both nostrils 2 (two) times daily. Decrease to 2 sprays/nostril daily after 5 days 11/26/13   Graylon GoodZachary H Rondarius Kadrmas, PA-C  meclizine (ANTIVERT) 25 MG tablet Take 1 tablet (25 mg total) by mouth 4 (four) times daily as needed for dizziness. 11/26/13   Adrian BlackwaterZachary H Andora Krull, PA-C  omeprazole (PRILOSEC) 20 MG capsule TAKE 1 CAPSULE BY MOUTH DAILY. 07/31/13   Etta Grandchildhomas L Jones, MD  predniSONE (DELTASONE) 10 MG tablet 4 tabs PO QD for 4 days; 3 tabs PO QD for 3 days; 2 tabs PO QD for 2 days; 1 tab PO QD for 1 day 11/26/13   Earna CoderZachary  H Vicki Pasqual, PA-C  sulfamethoxazole-trimethoprim (SEPTRA DS) 800-160 MG per tablet Take 1 tablet by mouth 2 (two) times daily. 04/25/12   Etta Grandchildhomas L Jones, MD   BP 145/95 mmHg  Pulse 78  Temp(Src) 97.4 F (36.3 C) (Oral)  Resp 16  SpO2 99% Physical Exam  Constitutional: She is oriented to person, place, and time. Vital signs are normal. She appears well-developed and well-nourished. No distress.  HENT:  Head: Normocephalic and atraumatic.  Right Ear: Tympanic membrane, external ear and ear canal normal.  Left Ear: External ear and ear canal normal. A middle ear effusion (serous) is present.  Nose: Nose normal. Right sinus exhibits no maxillary sinus tenderness and no  frontal sinus tenderness. Left sinus exhibits no maxillary sinus tenderness and no frontal sinus tenderness.  Mouth/Throat: Uvula is midline, oropharynx is clear and moist and mucous membranes are normal. No oropharyngeal exudate.  Eyes: Conjunctivae are normal. Right eye exhibits no discharge. Left eye exhibits no discharge.  Neck: Normal range of motion. Neck supple. No thyromegaly present.  Cardiovascular: Normal rate, regular rhythm, normal heart sounds and intact distal pulses.   Pulmonary/Chest: Effort normal and breath sounds normal. No respiratory distress. She has no wheezes. She has no rales.  Lymphadenopathy:    She has no cervical adenopathy.  Neurological: She is alert and oriented to person, place, and time. She has normal strength. Coordination normal.  Skin: Skin is warm and dry. No rash noted. She is not diaphoretic.  Psychiatric: She has a normal mood and affect. Judgment normal.  Nursing note and vitals reviewed.   ED Course  Procedures (including critical care time) Labs Review Labs Reviewed - No data to display  Imaging Review No results found.   MDM   1. Other fatigue   2. Positional vertigo, unspecified laterality   3. Acute serous otitis media of left ear, recurrence not specified    Most consistent with viral infection.  Treat symptomatically.  Return precautions discussed with the pt    Meds ordered this encounter  Medications  . predniSONE (DELTASONE) 10 MG tablet    Sig: 4 tabs PO QD for 4 days; 3 tabs PO QD for 3 days; 2 tabs PO QD for 2 days; 1 tab PO QD for 1 day    Dispense:  30 tablet    Refill:  0    Order Specific Question:  Supervising Provider    Answer:  Clementeen GrahamOREY, EVAN, S [[id number]]  . fluticasone (FLONASE) 50 MCG/ACT nasal spray    Sig: Place 2 sprays into both nostrils 2 (two) times daily. Decrease to 2 sprays/nostril daily after 5 days    Dispense:  16 g    Refill:  2    Order Specific Question:  Supervising Provider    Answer:  Clementeen GrahamOREY,  EVAN, S [[id number]]  . meclizine (ANTIVERT) 25 MG tablet    Sig: Take 1 tablet (25 mg total) by mouth 4 (four) times daily as needed for dizziness.    Dispense:  12 tablet    Refill:  0    Order Specific Question:  Supervising Provider    Answer:  Clementeen GrahamOREY, EVAN, Kathie RhodesS [[id number]]       Graylon GoodZachary H Shawntina Diffee, PA-C 11/26/13 [phone]

## 2013-11-27 ENCOUNTER — Ambulatory Visit: Payer: BC Managed Care – PPO | Admitting: Internal Medicine

## 2014-05-13 LAB — HM PAP SMEAR: HM Pap smear: NORMAL

## 2014-06-24 ENCOUNTER — Other Ambulatory Visit: Payer: Self-pay

## 2014-06-24 MED ORDER — OMEPRAZOLE 20 MG PO CPDR: 20.0000 mg | DELAYED_RELEASE_CAPSULE | Freq: Every day | ORAL | Status: DC

## 2014-07-02 ENCOUNTER — Encounter (INDEPENDENT_AMBULATORY_CARE_PROVIDER_SITE_OTHER): Payer: Self-pay

## 2014-07-02 ENCOUNTER — Ambulatory Visit (INDEPENDENT_AMBULATORY_CARE_PROVIDER_SITE_OTHER): Payer: 59 | Admitting: Internal Medicine

## 2014-07-02 ENCOUNTER — Encounter: Payer: Self-pay | Admitting: Internal Medicine

## 2014-07-02 ENCOUNTER — Other Ambulatory Visit (INDEPENDENT_AMBULATORY_CARE_PROVIDER_SITE_OTHER): Payer: 59

## 2014-07-02 VITALS — BP 124/82 | HR 82 | Temp 98.5°F | Resp 16 | Ht 66.0 in | Wt 259.0 lb

## 2014-07-02 DIAGNOSIS — Z9884 Bariatric surgery status: Secondary | ICD-10-CM

## 2014-07-02 DIAGNOSIS — D509 Iron deficiency anemia, unspecified: Secondary | ICD-10-CM

## 2014-07-02 DIAGNOSIS — R300 Dysuria: Secondary | ICD-10-CM

## 2014-07-02 DIAGNOSIS — K2100 Gastro-esophageal reflux disease with esophagitis, without bleeding: Secondary | ICD-10-CM

## 2014-07-02 DIAGNOSIS — I10 Essential (primary) hypertension: Secondary | ICD-10-CM

## 2014-07-02 DIAGNOSIS — N3000 Acute cystitis without hematuria: Secondary | ICD-10-CM

## 2014-07-02 DIAGNOSIS — E559 Vitamin D deficiency, unspecified: Secondary | ICD-10-CM

## 2014-07-02 LAB — CBC WITH DIFFERENTIAL/PLATELET
BASOS ABS: 0.1 10*3/uL (ref 0.0–0.1)
Basophils Relative: 0.8 % (ref 0.0–3.0)
EOS PCT: 1.9 % (ref 0.0–5.0)
Eosinophils Absolute: 0.2 10*3/uL (ref 0.0–0.7)
HEMATOCRIT: 38.1 % (ref 36.0–46.0)
Hemoglobin: 11.9 g/dL — ABNORMAL LOW (ref 12.0–15.0)
LYMPHS ABS: 2.9 10*3/uL (ref 0.7–4.0)
Lymphocytes Relative: 27.1 % (ref 12.0–46.0)
MCHC: 31.3 g/dL (ref 30.0–36.0)
MCV: 79.1 fl (ref 78.0–100.0)
MONOS PCT: 8.9 % (ref 3.0–12.0)
Monocytes Absolute: 0.9 10*3/uL (ref 0.1–1.0)
NEUTROS PCT: 61.3 % (ref 43.0–77.0)
Neutro Abs: 6.5 10*3/uL (ref 1.4–7.7)
PLATELETS: 368 10*3/uL (ref 150.0–400.0)
RBC: 4.82 Mil/uL (ref 3.87–5.11)
RDW: 15.8 % — AB (ref 11.5–15.5)
WBC: 10.6 10*3/uL — AB (ref 4.0–10.5)

## 2014-07-02 LAB — URINALYSIS, ROUTINE W REFLEX MICROSCOPIC
Bilirubin Urine: NEGATIVE
NITRITE: NEGATIVE
Specific Gravity, Urine: 1.02 (ref 1.000–1.030)
Urine Glucose: NEGATIVE
Urobilinogen, UA: 0.2 (ref 0.0–1.0)
pH: 7.5 (ref 5.0–8.0)

## 2014-07-02 LAB — COMPREHENSIVE METABOLIC PANEL WITH GFR
ALT: 13 U/L (ref 0–35)
AST: 13 U/L (ref 0–37)
Albumin: 3.8 g/dL (ref 3.5–5.2)
Alkaline Phosphatase: 56 U/L (ref 39–117)
BUN: 12 mg/dL (ref 6–23)
CO2: 26 meq/L (ref 19–32)
Calcium: 9.3 mg/dL (ref 8.4–10.5)
Chloride: 104 meq/L (ref 96–112)
Creatinine, Ser: 0.58 mg/dL (ref 0.40–1.20)
GFR: 118.6 mL/min
Glucose, Bld: 82 mg/dL (ref 70–99)
Potassium: 3.8 meq/L (ref 3.5–5.1)
Sodium: 136 meq/L (ref 135–145)
Total Bilirubin: 0.2 mg/dL (ref 0.2–1.2)
Total Protein: 7.6 g/dL (ref 6.0–8.3)

## 2014-07-02 LAB — VITAMIN B12: VITAMIN B 12: 368 pg/mL (ref 211–911)

## 2014-07-02 LAB — FERRITIN: Ferritin: 8.6 ng/mL — ABNORMAL LOW (ref 10.0–291.0)

## 2014-07-02 LAB — IBC PANEL
Iron: 26 ug/dL — ABNORMAL LOW (ref 42–145)
SATURATION RATIOS: 6.5 % — AB (ref 20.0–50.0)
TRANSFERRIN: 286 mg/dL (ref 212.0–360.0)

## 2014-07-02 LAB — FOLATE: FOLATE: 9.7 ng/mL (ref >5.9)

## 2014-07-02 LAB — VITAMIN D 25 HYDROXY (VIT D DEFICIENCY, FRACTURES): VITD: 9.59 ng/mL — ABNORMAL LOW (ref 30.00–100.00)

## 2014-07-02 LAB — HCG, QUANTITATIVE, PREGNANCY: Quantitative HCG: 0 m[IU]/mL

## 2014-07-02 LAB — TSH: TSH: 1.34 u[IU]/mL (ref 0.35–4.50)

## 2014-07-02 NOTE — Progress Notes (Signed)
Pre visit review using our clinic review tool, if applicable. No additional management support is needed unless otherwise documented below in the visit note. 

## 2014-07-02 NOTE — Patient Instructions (Signed)
Gastroesophageal Reflux Disease, Adult Gastroesophageal reflux disease (GERD) happens when acid from your stomach flows up into the esophagus. When acid comes in contact with the esophagus, the acid causes soreness (inflammation) in the esophagus. Over time, GERD may create small holes (ulcers) in the lining of the esophagus. CAUSES   Increased body weight. This puts pressure on the stomach, making acid rise from the stomach into the esophagus.  Smoking. This increases acid production in the stomach.  Drinking alcohol. This causes decreased pressure in the lower esophageal sphincter (valve or ring of muscle between the esophagus and stomach), allowing acid from the stomach into the esophagus.  Late evening meals and a full stomach. This increases pressure and acid production in the stomach.  A malformed lower esophageal sphincter. Sometimes, no cause is found. SYMPTOMS   Burning pain in the lower part of the mid-chest behind the breastbone and in the mid-stomach area. This may occur twice a week or more often.  Trouble swallowing.  Sore throat.  Dry cough.  Asthma-like symptoms including chest tightness, shortness of breath, or wheezing. DIAGNOSIS  Your caregiver may be able to diagnose GERD based on your symptoms. In some cases, X-rays and other tests may be done to check for complications or to check the condition of your stomach and esophagus. TREATMENT  Your caregiver may recommend over-the-counter or prescription medicines to help decrease acid production. Ask your caregiver before starting or adding any new medicines.  HOME CARE INSTRUCTIONS   Change the factors that you can control. Ask your caregiver for guidance concerning weight loss, quitting smoking, and alcohol consumption.  Avoid foods and drinks that make your symptoms worse, such as:  Caffeine or alcoholic drinks.  Chocolate.  Peppermint or mint flavorings.  Garlic and onions.  Spicy foods.  Citrus fruits,  such as oranges, lemons, or limes.  Tomato-based foods such as sauce, chili, salsa, and pizza.  Fried and fatty foods.  Avoid lying down for the 3 hours prior to your bedtime or prior to taking a nap.  Eat small, frequent meals instead of large meals.  Wear loose-fitting clothing. Do not wear anything tight around your waist that causes pressure on your stomach.  Raise the head of your bed 6 to 8 inches with wood blocks to help you sleep. Extra pillows will not help.  Only take over-the-counter or prescription medicines for pain, discomfort, or fever as directed by your caregiver.  Do not take aspirin, ibuprofen, or other nonsteroidal anti-inflammatory drugs (NSAIDs). SEEK IMMEDIATE MEDICAL CARE IF:   You have pain in your arms, neck, jaw, teeth, or back.  Your pain increases or changes in intensity or duration.  You develop nausea, vomiting, or sweating (diaphoresis).  You develop shortness of breath, or you faint.  Your vomit is green, yellow, black, or looks like coffee grounds or blood.  Your stool is red, bloody, or black. These symptoms could be signs of other problems, such as heart disease, gastric bleeding, or esophageal bleeding. MAKE SURE YOU:   Understand these instructions.  Will watch your condition.  Will get help right away if you are not doing well or get worse. Document Released: 10/05/2004 Document Revised: 03/20/2011 Document Reviewed: 07/15/2010 Exit[employer] Patient Information 2015 [employer]. This information is not intended to replace advice given to you by your health care provider. Make sure you discuss any questions you have with your health care provider.  

## 2014-07-02 NOTE — Progress Notes (Signed)
Subjective:  Patient ID: Valerie Sweeney, female    DOB: 1965/08/06  Age: 49 y.o. MRN: [id number]  CC: Gastrophageal Reflux and Hypertension   HPI Valerie Sweeney presents for worsening heartburn, dysuria, urinary frequency/urgency, metallic tongue taste, and fatigue.  Outpatient Prescriptions Prior to Visit  Medication Sig Dispense Refill  . norethindrone-ethinyl estradiol (JUNEL FE,GILDESS FE,LOESTRIN FE) 1-20 MG-MCG tablet Take 1 tablet by mouth daily.    . Calcium-Vitamin A-Vitamin D (LIQUID CALCIUM PO) Take by mouth.  daily    . cyanocobalamin 500 MCG tablet Take 500 mcg by mouth daily.    . ferrous sulfate 325 (65 FE) MG tablet TAKE 1 TABLET BY MOUTH DAILY. (Patient not taking: Reported on 07/02/2014) 30 tablet 5  . fluticasone (FLONASE) 50 MCG/ACT nasal spray Place 2 sprays into both nostrils 2 (two) times daily. Decrease to 2 sprays/nostril daily after 5 days (Patient not taking: Reported on 07/02/2014) 16 g 2  . meclizine (ANTIVERT) 25 MG tablet Take 1 tablet (25 mg total) by mouth 4 (four) times daily as needed for dizziness. (Patient not taking: Reported on 07/02/2014) 12 tablet 0  . Multiple Vitamin (MULITIVITAMIN WITH MINERALS) TABS Take 2 tablets by mouth daily.     Marland Kitchen omeprazole (PRILOSEC) 20 MG capsule TAKE 1 CAPSULE BY MOUTH DAILY. (Patient not taking: Reported on 07/02/2014) 90 capsule 3  . omeprazole (PRILOSEC) 20 MG capsule Take 1 capsule (20 mg total) by mouth daily. (Patient not taking: Reported on 07/02/2014) 30 capsule 0  . predniSONE (DELTASONE) 10 MG tablet 4 tabs PO QD for 4 days; 3 tabs PO QD for 3 days; 2 tabs PO QD for 2 days; 1 tab PO QD for 1 day (Patient not taking: Reported on 07/02/2014) 30 tablet 0  . sulfamethoxazole-trimethoprim (SEPTRA DS) 800-160 MG per tablet Take 1 tablet by mouth 2 (two) times daily. (Patient not taking: Reported on 07/02/2014) 20 tablet 0   No facility-administered medications prior to visit.    ROS Review of Systems    Constitutional: Positive for fatigue. Negative for fever, chills, diaphoresis, activity change, appetite change and unexpected weight change.  HENT: Negative.  Negative for congestion, facial swelling, nosebleeds, postnasal drip, rhinorrhea, sore throat, trouble swallowing and voice change.   Eyes: Negative.   Respiratory: Negative.  Negative for cough, choking, chest tightness, shortness of breath and stridor.   Cardiovascular: Negative.  Negative for chest pain, palpitations and leg swelling.  Gastrointestinal: Positive for nausea. Negative for vomiting, abdominal pain, diarrhea, constipation, blood in stool, anal bleeding and rectal pain.       She thinks she brought up some blood a week ago but has not seen any since then  Endocrine: Negative.   Genitourinary: Positive for dysuria, urgency and frequency. Negative for hematuria, flank pain, decreased urine volume, enuresis, difficulty urinating and dyspareunia.  Musculoskeletal: Positive for back pain. Negative for myalgias, joint swelling, arthralgias, gait problem, neck pain and neck stiffness.  Skin: Negative.  Negative for pallor and rash.  Allergic/Immunologic: Negative.   Neurological: Negative.  Negative for dizziness.  Hematological: Negative.  Negative for adenopathy. Does not bruise/bleed easily.  Psychiatric/Behavioral: Negative.     Objective:  BP 124/82 mmHg  Pulse 82  Temp(Src) 98.5 F (36.9 C) (Oral)  Resp 16  Ht  (1.676 m)  Wt 259 lb (117.482 kg)  BMI 41.82 kg/m2  SpO2 97%  BP Readings from Last 3 Encounters:  07/02/14 124/82  11/26/13 145/95  04/25/12 120/80    Wt Readings  from Last 3 Encounters:  07/02/14 259 lb (117.482 kg)  04/25/12 209 lb 6.4 oz (94.983 kg)  03/18/12 206 lb 4.8 oz (93.577 kg)    Physical Exam  Constitutional: She is oriented to person, place, and time. She appears well-developed and well-nourished. No distress.  HENT:  Head: Normocephalic and atraumatic.  Mouth/Throat:  Oropharynx is clear and moist. No oropharyngeal exudate.  Eyes: Conjunctivae are normal. Right eye exhibits no discharge. Left eye exhibits no discharge. No scleral icterus.  Neck: Normal range of motion. Neck supple. No JVD present. No tracheal deviation present. No thyromegaly present.  Cardiovascular: Normal rate, regular rhythm, normal heart sounds and intact distal pulses.  Exam reveals no gallop and no friction rub.   No murmur heard. Pulmonary/Chest: Effort normal and breath sounds normal. No stridor. No respiratory distress. She has no wheezes. She has no rales. She exhibits no tenderness.  Abdominal: Soft. Bowel sounds are normal. She exhibits no distension and no mass. There is no tenderness. There is no rebound and no guarding.  Musculoskeletal: Normal range of motion. She exhibits no edema or tenderness.       Lumbar back: Normal. She exhibits normal range of motion, no tenderness, no bony tenderness, no swelling, no edema, no deformity, no laceration, no pain, no spasm and normal pulse.  Lymphadenopathy:    She has no cervical adenopathy.  Neurological: She is alert and oriented to person, place, and time. She has normal strength and normal reflexes. She displays no atrophy, no tremor and normal reflexes. No cranial nerve deficit or sensory deficit. She exhibits normal muscle tone. She displays a negative Romberg sign. She displays no seizure activity. Coordination and gait normal.  Skin: Skin is warm and dry. No rash noted. She is not diaphoretic. No erythema. No pallor.  Psychiatric: She has a normal mood and affect. Her behavior is normal. Judgment and thought content normal.  Vitals reviewed.   Lab Results  Component Value Date   WBC 10.6* 07/02/2014   HGB 11.9* 07/02/2014   HCT 38.1 07/02/2014   PLT 368.0 07/02/2014   GLUCOSE 82 07/02/2014   CHOL 164 09/22/2010   TRIG 126.0 09/22/2010   HDL 42.40 09/22/2010   LDLCALC 96 09/22/2010   ALT 13 07/02/2014   AST 13 07/02/2014    NA 136 07/02/2014   K 3.8 07/02/2014   CL 104 07/02/2014   CREATININE 0.58 07/02/2014   BUN 12 07/02/2014   CO2 26 07/02/2014   TSH 1.34 07/02/2014   HGBA1C 5.2 05/12/2011    No results found.  Assessment & Plan:   Kayliee was seen today for gastrophageal reflux and hypertension.  Diagnoses and all orders for this visit:  Status post bariatric surgery - she has worsening GERD and possible hematemesis with nausea, will refer to GI to consider upper endoscopy, will check her labs today to screen for vit deficiencies Orders: -     Vitamin B12; Future -     IBC panel; Future -     Zinc; Future -     Vit D  25 hydroxy (rtn osteoporosis monitoring); Future -     Vitamin B6; Future -     Vitamin B1; Future -     Folate; Future -     Ferritin; Future -     CBC with Differential/Platelet; Future -     hCG, quantitative, pregnancy; Future -     FeBisg-FeCbn-C-FA-B12-Biot-DSS (FERIVA) 75-1 MG CAPS; Take 1 capsule by mouth daily.  Gastroesophageal reflux  disease with esophagitis - cont the PPI, GI referral Orders: -     CBC with Differential/Platelet; Future -     hCG, quantitative, pregnancy; Future -     Ambulatory referral to Gastroenterology  Essential hypertension, benign - her BP is well controlled Orders: -     Comprehensive metabolic panel; Future -     TSH; Future -     CBC with Differential/Platelet; Future -     hCG, quantitative, pregnancy; Future  Dysuria Orders: -     hCG, quantitative, pregnancy; Future -     Urinalysis, Routine w reflex microscopic (not at [facility]); Future -     CULTURE, URINE COMPREHENSIVE; Future -     sulfamethoxazole-trimethoprim (BACTRIM DS,SEPTRA DS) 800-160 MG per tablet; Take 1 tablet by mouth 2 (two) times daily.  Vitamin D deficiency Orders: -     Vitamin D, Ergocalciferol, (DRISDOL) 50000 UNITS CAPS capsule; Take 1 capsule (50,000 Units total) by mouth every 7 (seven) days.  Iron deficiency anemia Orders: -      FeBisg-FeCbn-C-FA-B12-Biot-DSS (FERIVA) 75-1 MG CAPS; Take 1 capsule by mouth daily. -     Ambulatory referral to Gastroenterology  Acute cystitis without hematuria Orders: -     sulfamethoxazole-trimethoprim (BACTRIM DS,SEPTRA DS) 800-160 MG per tablet; Take 1 tablet by mouth 2 (two) times daily.  I have discontinued Ms. Tritch's multivitamin with minerals, Calcium-Vitamin A-Vitamin D (LIQUID CALCIUM PO), cyanocobalamin, sulfamethoxazole-trimethoprim, ferrous sulfate, predniSONE, fluticasone, and meclizine. I am also having her start on FERIVA, Vitamin D (Ergocalciferol), and sulfamethoxazole-trimethoprim. Additionally, I am having her maintain her norethindrone-ethinyl estradiol and omeprazole.  Meds ordered this encounter  Medications  . omeprazole (PRILOSEC) 20 MG capsule    Sig:   . FeBisg-FeCbn-C-FA-B12-Biot-DSS (FERIVA) 75-1 MG CAPS    Sig: Take 1 capsule by mouth daily.    Dispense:  30 capsule    Refill:  11  . Vitamin D, Ergocalciferol, (DRISDOL) 50000 UNITS CAPS capsule    Sig: Take 1 capsule (50,000 Units total) by mouth every 7 (seven) days.    Dispense:  12 capsule    Refill:  3  . sulfamethoxazole-trimethoprim (BACTRIM DS,SEPTRA DS) 800-160 MG per tablet    Sig: Take 1 tablet by mouth 2 (two) times daily.    Dispense:  10 tablet    Refill:  1     Follow-up: Return in about 4 weeks (around 07/30/2014).  Sanda Linger, MD

## 2014-07-03 DIAGNOSIS — E559 Vitamin D deficiency, unspecified: Secondary | ICD-10-CM | POA: Insufficient documentation

## 2014-07-03 DIAGNOSIS — D509 Iron deficiency anemia, unspecified: Secondary | ICD-10-CM | POA: Insufficient documentation

## 2014-07-03 MED ORDER — VITAMIN D (ERGOCALCIFEROL) 1.25 MG (50000 UNIT) PO CAPS: 50000.0000 [IU] | ORAL_CAPSULE | ORAL | Status: DC

## 2014-07-03 MED ORDER — FERIVA 75-1 MG PO CAPS
1.0000 | ORAL_CAPSULE | Freq: Every day | ORAL | Status: DC
Start: 2014-07-03 — End: 2014-07-08

## 2014-07-04 DIAGNOSIS — N3000 Acute cystitis without hematuria: Secondary | ICD-10-CM | POA: Insufficient documentation

## 2014-07-04 MED ORDER — SULFAMETHOXAZOLE-TRIMETHOPRIM 800-160 MG PO TABS: 1.0000 | ORAL_TABLET | Freq: Two times a day (BID) | ORAL | Status: AC

## 2014-07-05 LAB — CULTURE, URINE COMPREHENSIVE: Colony Count: >=100000

## 2014-07-06 ENCOUNTER — Encounter: Payer: Self-pay | Admitting: Internal Medicine

## 2014-07-06 LAB — ZINC: ZINC: 52 ug/dL — AB (ref 60–130)

## 2014-07-07 ENCOUNTER — Encounter: Payer: Self-pay | Admitting: Internal Medicine

## 2014-07-07 ENCOUNTER — Other Ambulatory Visit: Payer: Self-pay | Admitting: Internal Medicine

## 2014-07-07 DIAGNOSIS — E60 Dietary zinc deficiency: Secondary | ICD-10-CM | POA: Insufficient documentation

## 2014-07-07 LAB — VITAMIN B1: VITAMIN B1 (THIAMINE): 11 nmol/L (ref 8–30)

## 2014-07-07 MED ORDER — ZINC GLUCONATE 50 MG PO TABS: 50.0000 mg | ORAL_TABLET | Freq: Every day | ORAL | Status: DC

## 2014-07-08 ENCOUNTER — Other Ambulatory Visit: Payer: Self-pay | Admitting: Internal Medicine

## 2014-07-08 ENCOUNTER — Encounter: Payer: Self-pay | Admitting: Internal Medicine

## 2014-07-08 DIAGNOSIS — D509 Iron deficiency anemia, unspecified: Secondary | ICD-10-CM

## 2014-07-08 LAB — VITAMIN B6: VITAMIN B6: 20.3 ng/mL (ref 2.1–21.7)

## 2014-07-08 MED ORDER — FERIVA 21/7 75-1 MG PO TABS: 1.0000 | ORAL_TABLET | Freq: Every day | ORAL | Status: DC

## 2014-07-08 MED ORDER — FERROUS SULFATE 325 (65 FE) MG PO TABS: 325.0000 mg | ORAL_TABLET | Freq: Two times a day (BID) | ORAL | Status: DC

## 2014-07-08 NOTE — Telephone Encounter (Signed)
Received a fax from [facility] for the prescription Feriva 21/7tablets, drug is not covered, will cost $461.57,  Please suggest alternative.

## 2014-07-16 ENCOUNTER — Other Ambulatory Visit: Payer: Self-pay | Admitting: Internal Medicine

## 2014-08-17 ENCOUNTER — Encounter: Payer: Self-pay | Admitting: *Deleted

## 2014-08-19 ENCOUNTER — Encounter: Payer: Self-pay | Admitting: Internal Medicine

## 2014-08-19 ENCOUNTER — Ambulatory Visit (INDEPENDENT_AMBULATORY_CARE_PROVIDER_SITE_OTHER): Payer: 59 | Admitting: Internal Medicine

## 2014-08-19 VITALS — BP 118/90 | HR 92 | Temp 98.6°F | Resp 16 | Ht 66.0 in | Wt 257.0 lb

## 2014-08-19 DIAGNOSIS — A09 Infectious gastroenteritis and colitis, unspecified: Secondary | ICD-10-CM | POA: Insufficient documentation

## 2014-08-19 DIAGNOSIS — R197 Diarrhea, unspecified: Secondary | ICD-10-CM

## 2014-08-19 MED ORDER — METRONIDAZOLE 500 MG PO TABS
500.0000 mg | ORAL_TABLET | Freq: Three times a day (TID) | ORAL | Status: AC
Start: 2014-08-19 — End: 2014-08-29

## 2014-08-19 MED ORDER — CIPROFLOXACIN HCL 500 MG PO TABS: 500.0000 mg | ORAL_TABLET | Freq: Two times a day (BID) | ORAL | Status: AC

## 2014-08-19 MED ORDER — CIPROFLOXACIN HCL 500 MG PO TABS: 500.0000 mg | ORAL_TABLET | Freq: Two times a day (BID) | ORAL | Status: DC

## 2014-08-19 NOTE — Patient Instructions (Signed)

## 2014-08-19 NOTE — Progress Notes (Signed)
Subjective:  Patient ID: Valerie Sweeney, female    DOB: 06/13/1965  Age: 49 y.o. MRN: [id number]  CC: Diarrhea   HPI Valerie Sweeney presents for watery diarrhea after a trip to [place] She complains of about 6 watery, mucousy bowel movements per day. She has had mild intermittent abdominal cramping. There has been no blood in her stool. She complains of nausea but no vomiting, fever, or chills.  Outpatient Prescriptions Prior to Visit  Medication Sig Dispense Refill  . ferrous sulfate 325 (65 FE) MG tablet Take 1 tablet (325 mg total) by mouth 2 (two) times daily with a meal. 180 tablet 3  . norethindrone-ethinyl estradiol (JUNEL FE,GILDESS FE,LOESTRIN FE) 1-20 MG-MCG tablet Take 1 tablet by mouth daily.    Marland Kitchen omeprazole (PRILOSEC) 20 MG capsule TAKE ONE CAPSULE BY MOUTH ONCE DAILY 30 capsule 11  . Vitamin D, Ergocalciferol, (DRISDOL) 50000 UNITS CAPS capsule Take 1 capsule (50,000 Units total) by mouth every 7 (seven) days. 12 capsule 3  . zinc gluconate 50 MG tablet Take 1 tablet (50 mg total) by mouth daily. 90 tablet 3  . omeprazole (PRILOSEC) 20 MG capsule      No facility-administered medications prior to visit.    ROS Review of Systems  Constitutional: Negative.  Negative for fever, chills, diaphoresis, appetite change and fatigue.  HENT: Negative.   Eyes: Negative.   Respiratory: Negative.  Negative for cough, choking, chest tightness, shortness of breath and stridor.   Cardiovascular: Negative.  Negative for chest pain, palpitations and leg swelling.  Gastrointestinal: Positive for diarrhea. Negative for vomiting, abdominal pain, constipation, blood in stool, abdominal distention, anal bleeding and rectal pain.  Endocrine: Negative.   Genitourinary: Negative.   Musculoskeletal: Negative.   Skin: Negative.   Allergic/Immunologic: Negative.   Neurological: Negative.  Negative for dizziness, tremors, syncope, speech difficulty, weakness, light-headedness and  numbness.  Hematological: Negative.  Negative for adenopathy. Does not bruise/bleed easily.  Psychiatric/Behavioral: Negative.     Objective:  BP 118/90 mmHg  Pulse 92  Temp(Src) 98.6 F (37 C) (Oral)  Resp 16  Ht  (1.676 m)  Wt 257 lb (116.574 kg)  BMI 41.50 kg/m2  SpO2 96%  BP Readings from Last 3 Encounters:  08/19/14 118/90  07/02/14 124/82  11/26/13 145/95    Wt Readings from Last 3 Encounters:  08/19/14 257 lb (116.574 kg)  07/02/14 259 lb (117.482 kg)  04/25/12 209 lb 6.4 oz (94.983 kg)    Physical Exam  Lab Results  Component Value Date   WBC 10.6* 07/02/2014   HGB 11.9* 07/02/2014   HCT 38.1 07/02/2014   PLT 368.0 07/02/2014   GLUCOSE 82 07/02/2014   CHOL 164 09/22/2010   TRIG 126.0 09/22/2010   HDL 42.40 09/22/2010   LDLCALC 96 09/22/2010   ALT 13 07/02/2014   AST 13 07/02/2014   NA 136 07/02/2014   K 3.8 07/02/2014   CL 104 07/02/2014   CREATININE 0.58 07/02/2014   BUN 12 07/02/2014   CO2 26 07/02/2014   TSH 1.34 07/02/2014   HGBA1C 5.2 05/12/2011    No results found.  Assessment & Plan:   Valerie Sweeney was seen today for diarrhea.  Diagnoses and all orders for this visit:  Diarrhea- labs ordered to check for leukocytosis, anemia, abnormal electrolytes, dehydration, renal dysfunction. -     Basic metabolic panel; Future -     CBC with Differential/Platelet; Future -     Sedimentation rate; Future -  Clostridium difficile EIA; Future -     Giardia/cryptosporidium (EIA); Future -     Fecal lactoferrin; Future -     Ova and parasite examination; Future  Diarrhea, infectious, adult- she has traveled to [place] so have I have asked her to submit stool studies today to identify the cause for her infection, in the meantime will treat her for traveler's diarrhea, parasitic infection, cholera, and  C diff with cipro and flagyl -     Basic metabolic panel; Future -     CBC with Differential/Platelet; Future -     Sedimentation rate; Future -      Clostridium difficile EIA; Future -     Giardia/cryptosporidium (EIA); Future -     Fecal lactoferrin; Future -     Ova and parasite examination; Future   I am having Valerie Sweeney maintain her norethindrone-ethinyl estradiol, Vitamin D (Ergocalciferol), zinc gluconate, ferrous sulfate, omeprazole, and multivitamin with minerals.  Meds ordered this encounter  Medications  . Multiple Vitamin (MULTIVITAMIN WITH MINERALS) TABS tablet    Sig: Take 1 tablet by mouth daily.     Follow-up: No Follow-up on file.  Sanda Linger, MD

## 2014-08-19 NOTE — Progress Notes (Signed)
Pre visit review using our clinic review tool, if applicable. No additional management support is needed unless otherwise documented below in the visit note. 

## 2014-09-08 ENCOUNTER — Ambulatory Visit (INDEPENDENT_AMBULATORY_CARE_PROVIDER_SITE_OTHER): Payer: 59 | Admitting: Internal Medicine

## 2014-09-08 ENCOUNTER — Encounter: Payer: Self-pay | Admitting: Internal Medicine

## 2014-09-08 ENCOUNTER — Other Ambulatory Visit (INDEPENDENT_AMBULATORY_CARE_PROVIDER_SITE_OTHER): Payer: 59

## 2014-09-08 VITALS — BP 108/76 | HR 83 | Temp 98.5°F | Resp 16 | Wt 258.0 lb

## 2014-09-08 VITALS — BP 128/90 | HR 68 | Ht 66.0 in | Wt 258.4 lb

## 2014-09-08 DIAGNOSIS — R110 Nausea: Secondary | ICD-10-CM | POA: Diagnosis not present

## 2014-09-08 DIAGNOSIS — D509 Iron deficiency anemia, unspecified: Secondary | ICD-10-CM | POA: Diagnosis not present

## 2014-09-08 DIAGNOSIS — K219 Gastro-esophageal reflux disease without esophagitis: Secondary | ICD-10-CM

## 2014-09-08 DIAGNOSIS — L500 Allergic urticaria: Secondary | ICD-10-CM | POA: Diagnosis not present

## 2014-09-08 DIAGNOSIS — K920 Hematemesis: Secondary | ICD-10-CM

## 2014-09-08 LAB — CBC WITH DIFFERENTIAL/PLATELET
BASOS ABS: 0 10*3/uL (ref 0.0–0.1)
Basophils Relative: 0.5 % (ref 0.0–3.0)
EOS ABS: 0.3 10*3/uL (ref 0.0–0.7)
Eosinophils Relative: 3.6 % (ref 0.0–5.0)
HEMATOCRIT: 42.5 % (ref 36.0–46.0)
HEMOGLOBIN: 13.5 g/dL (ref 12.0–15.0)
LYMPHS PCT: 31.5 % (ref 12.0–46.0)
Lymphs Abs: 2.6 10*3/uL (ref 0.7–4.0)
MCHC: 31.8 g/dL (ref 30.0–36.0)
MCV: 81.4 fl (ref 78.0–100.0)
Monocytes Absolute: 0.7 10*3/uL (ref 0.1–1.0)
Monocytes Relative: 8.3 % (ref 3.0–12.0)
NEUTROS ABS: 4.6 10*3/uL (ref 1.4–7.7)
Neutrophils Relative %: 56.1 % (ref 43.0–77.0)
PLATELETS: 347 10*3/uL (ref 150.0–400.0)
RBC: 5.23 Mil/uL — AB (ref 3.87–5.11)
RDW: 16.6 % — ABNORMAL HIGH (ref 11.5–15.5)
WBC: 8.1 10*3/uL (ref 4.0–10.5)

## 2014-09-08 MED ORDER — RANITIDINE HCL 150 MG PO TABS: 150.0000 mg | ORAL_TABLET | Freq: Two times a day (BID) | ORAL | Status: DC

## 2014-09-08 NOTE — Patient Instructions (Addendum)
Avoid soaps and cosmetics which are not hypoallergenic. Restrict hyperallergenic foods at this time: Nuts, strawberries, seafood , chocolate, and tomatoes. Take the ranitidine 30 minutes before breakfast and evening meal in place of Omeprazole This blocks a histamine pathway as we discussed.   Please take Zyrtec 10 mg at bedtime instead of the Benadryl.   if you have any skin eruption; please document any dietary , medicinal, chemical or environmental exposures in the previous 8-12 hours.  Please take a probiotic , Florastor OR Align, every day if the bowels are loose. This will replace the normal bacteria which  are necessary for formation of normal stool and processing of food.

## 2014-09-08 NOTE — Progress Notes (Signed)
Patient ID: Valerie Sweeney, female   DOB: 07-17-1965, 49 y.o.   MRN: [id number] HPI: Jeronica Stlouis is a 49 year old female with past medical history of GERD, hypertension, obesity status post gastric sleeve in 2013, iron deficiency anemia who seen in consultation at the request of Dr. Yetta Barre to evaluate iron deficiency anemia, worsening GERD and hematemesis. She is here alone today. She reports that over the last few months she has developed worsening of her heartburn symptoms. She does have long-standing history of heartburn though after her gastric sleeve placement by Dr. Biagio Quint in 2013 she was having no issues with acid reflux. She was able to lose 100 pounds though she has gained about 60 of these pounds back. She been taking omeprazole 20 mg daily and having heartburn despite this dose. Occasionally she will take doses early. About 2 months ago she was having issues with nausea and vomiting and on 2 episodes had hematemesis of bright red blood. No hematemesis in the last several weeks. In fact no recent nausea or vomiting. She reports recently appetite has been good. She has recently been diagnosed with iron deficiency anemia though she reports she's had borderline anemia for many years. Her menstrual periods are light and erratic in fact have become more erratic and less heavy over the years. She has recently started iron supplementation under the direction of primary care. She denies dysphagia or odynophagia. Reports bowel movements are dark on iron but denies seeing visible blood or melena. Denies diarrhea or constipation. She has recently developed allergy and hives mostly on her chest and upper extremities. Dr. Alwyn Ren saw her today and changed omeprazole to ranitidine 150 twice a day to help with urticaria. She reports that her diet is heavy in chocolate and she craves chocolate frequently. Denies family history of GI tract malignancy or IBD. She did return to her home country of [place] about a month ago and  returned with an infectious diarrhea. This was treated successfully with antibiotics and completely resolved. She denies tobacco and alcohol use. She is working on her PhD. Geronimo Running married with 3 children  Past Medical History  Diagnosis Date  . Hypertension   . GERD (gastroesophageal reflux disease)   . Allergy     SEASONAL ALLERGIES  . Anemia     TAKES IRON AND STATES ALWAYS BEEN ANEMIC  . Knee pain     MORE DIFFICULT TO GET UP AND DOWN--RELATES TO HER WEIGHT  . Hiatal hernia   . Esophageal dysmotilities   . Vitamin D deficiency     Past Surgical History  Procedure Laterality Date  . Cholecystectomy    . Breath tek h pylori  01/24/2011    Procedure: BREATH TEK H PYLORI;  Surgeon: Kandis Cocking, MD;  Location: [facility];  Service: General;  Laterality: N/A;  . Laparoscopic gastrectomy      sleeve    Outpatient Prescriptions Prior to Visit  Medication Sig Dispense Refill  . ferrous sulfate 325 (65 FE) MG tablet Take 1 tablet (325 mg total) by mouth 2 (two) times daily with a meal. 180 tablet 3  . Multiple Vitamin (MULTIVITAMIN WITH MINERALS) TABS tablet Take 1 tablet by mouth daily.    . ranitidine (ZANTAC) 150 MG tablet Take 1 tablet (150 mg total) by mouth 2 (two) times daily. 60 tablet 2  . Vitamin D, Ergocalciferol, (DRISDOL) 50000 UNITS CAPS capsule Take 1 capsule (50,000 Units total) by mouth every 7 (seven) days. 12 capsule 3  . zinc gluconate 50  MG tablet Take 1 tablet (50 mg total) by mouth daily. 90 tablet 3  . omeprazole (PRILOSEC) 20 MG capsule TAKE ONE CAPSULE BY MOUTH ONCE DAILY (Patient not taking: Reported on 09/08/2014) 30 capsule 11   No facility-administered medications prior to visit.    Allergies  Allergen Reactions  . Pork-Derived Products Other (See Comments)    Belief     Family History  Problem Relation Age of Onset  . Diabetes Mother   . Stroke Father   . Hypertension Father   . Cancer Neg Hx     Social History  Substance Use Topics  .  Smoking status: Never Smoker   . Smokeless tobacco: Never Used  . Alcohol Use: No    ROS: As per history of present illness, otherwise negative  BP 128/90 mmHg  Pulse 68  Ht 5\' 6"  (1.676 m)  Wt 258 lb 6.4 oz (117.209 kg)  BMI 41.73 kg/m2 Constitutional: Well-developed and well-nourished. No distress. HEENT: Normocephalic and atraumatic. Oropharynx is clear and moist. No oropharyngeal exudate. Conjunctivae are normal.  No scleral icterus. Neck: Neck supple. Trachea midline. Cardiovascular: Normal rate, regular rhythm and intact distal pulses. No M/R/G Pulmonary/chest: Effort normal and breath sounds normal. No wheezing, rales or rhonchi. Abdominal: Soft, nontender, nondistended. Bowel sounds active throughout. There are no masses palpable. No hepatosplenomegaly. Extremities: no clubbing, cyanosis, or edema Lymphadenopathy: No cervical adenopathy noted. Neurological: Alert and oriented to person place and time. Skin: Skin is warm and dry. No rashes noted. Psychiatric: Normal mood and affect. Behavior is normal.  RELEVANT LABS AND IMAGING: CBC    Component Value Date/Time   WBC 8.1 09/08/2014 1404   RBC 5.23* 09/08/2014 1404   HGB 13.5 09/08/2014 1404   HCT 42.5 09/08/2014 1404   PLT 347.0 09/08/2014 1404   MCV 81.4 09/08/2014 1404   MCH 26.7 09/29/2011 1240   MCHC 31.8 09/08/2014 1404   RDW 16.6* 09/08/2014 1404   LYMPHSABS 2.6 09/08/2014 1404   MONOABS 0.7 09/08/2014 1404   EOSABS 0.3 09/08/2014 1404   BASOSABS 0.0 09/08/2014 1404    CMP     Component Value Date/Time   NA 136 07/02/2014 1641   K 3.8 07/02/2014 1641   CL 104 07/02/2014 1641   CO2 26 07/02/2014 1641   GLUCOSE 82 07/02/2014 1641   BUN 12 07/02/2014 1641   CREATININE 0.58 07/02/2014 1641   CREATININE 0.57 09/29/2011 1240   CALCIUM 9.3 07/02/2014 1641   PROT 7.6 07/02/2014 1641   ALBUMIN 3.8 07/02/2014 1641   AST 13 07/02/2014 1641   ALT 13 07/02/2014 1641   ALKPHOS 56 07/02/2014 1641   BILITOT  0.2 07/02/2014 1641   GFRNONAA >90 03/22/2011 0435   GFRAA >90 03/22/2011 0435   Iron/TIBC/Ferritin/ %Sat    Component Value Date/Time   IRON 26* 07/02/2014 1641   TIBC 324 09/29/2011 1240   FERRITIN 8.6* 07/02/2014 1641   IRONPCTSAT 6.5* 07/02/2014 1641   IRONPCTSAT 8* 09/29/2011 1240    ASSESSMENT/PLAN: 49 year old female with past medical history of GERD, hypertension, obesity status post gastric sleeve in 2013, iron deficiency anemia who seen in consultation at the request of Dr. Yetta Barre to evaluate iron deficiency anemia, worsening GERD and hematemesis.  1. GERD/hematemesis/IDA/history of sleeve gastrectomy -- I recommended upper endoscopy and colonoscopy to evaluate both her GERD symptoms, hematemesis but also iron deficiency anemia. We discussed the risks, benefits and alternatives to these procedures and she is agreeable to proceed. Worsening of GERD may relate to weight  gain which has happened in the last year despite having lost 100 pounds initially with gastric surgery. Her diet also is heavy and chocolate which is reflux promoting. We reviewed the GERD diet which was recommended today. She's been started on ranitidine 150 mg twice a day we will see if this is enough to completely control heartburn, if not I would recommend increasing her PPI to 40 mg daily as 20 mg was not completely controlling symptoms. Continue iron supplementation and further recommendations after procedures.    ZO:XWRUEA L Jones, Md [address] [facility] [address] [place], [place] [address]

## 2014-09-08 NOTE — Progress Notes (Signed)
   Subjective:    Patient ID: Valerie Sweeney, female    DOB: 1965/09/03, 49 y.o.   MRN: [id number]  HPI   2-3 days ago she noted a papule over the right forearm which resolved. This was slightly itchy. She subsequently developed lesions over the neck and shoulders posteriorly on the left and anteriorly on the right. She does take Benadryl 1-2 at bedtime.  She had no specific triggers for this such as change in diet, cosmetics, or other chemical agents.  She has had chronic anemia since age 34 or 74. She's also has chronic loose stools. She denies any significant myalgias.  She was treated for frank diarrhea with metronidazole and Cipro with resolution earlier this month.  PMH of Bariatric Surgery.   Review of Systems  No associated itchy, watery eyes.  Swelling of the lips or tongue or intraoral lesions denied.  Shortness of breath, wheezing, or cough absent.  No vesicles, pustules noted.  Fever ,chills , or sweats denied.   No dysuria, pyuria or hematuria.     Objective:   Physical Exam   There is a small papule over the ulnar right forearm. There is an ill-defined erythematous area over the right lateral elbow area. She has a 4.5 x 5 cm erythematous raised area over the left posterior neck with smaller such lesions above and below that. There is also an extensive 10.5 x 9 area of scattered urticarial lesions over the right clavicular area. Dermatographia is elicitable.  General appearance:Adequately nourished; no acute distress or increased work of breathing is present.    Lymphatic: No  lymphadenopathy about the head, neck, or axilla .  Eyes: No conjunctival inflammation or lid edema is present. There is no scleral icterus.  Ears:  External ear exam shows no significant lesions or deformities.   Nose:  External nasal examination shows no deformity or inflammation. Nasal mucosa are pink and moist without lesions or exudates No septal dislocation or deviation.No  obstruction to airflow.   Oral exam: Dental hygiene is good; lips and gums are healthy appearing.There is no oropharyngeal erythema or exudate .  Neck:  No deformities, thyromegaly, masses, or tenderness noted.   Supple with full range of motion without pain.   Heart:  Normal rate and regular rhythm. S1 and S2 normal without gallop, murmur, click, rub or other extra sounds.   Lungs:Chest clear to auscultation; no wheezes, rhonchi,rales ,or rubs present.  Extremities:  No cyanosis, edema, or clubbing  noted    Skin: Warm & dry w/o tenting .      Assessment & Plan:  #1 urticaria  #2 loose stools  Plan: See orders recommendations

## 2014-09-08 NOTE — Progress Notes (Signed)
Pre visit review using our clinic review tool, if applicable. No additional management support is needed unless otherwise documented below in the visit note. 

## 2014-09-08 NOTE — Patient Instructions (Signed)
You have been scheduled for a colonoscopy. Please follow written instructions given to you at your visit today.  Please pick up your prep supplies at the pharmacy within the next 1-3 days. If you use inhalers (even only as needed), please bring them with you on the day of your procedure. Your physician has requested that you go to www.startemmi.com and enter the access code given to you at your visit today. This web site gives a general overview about your procedure. However, you should still follow specific instructions given to you by our office regarding your preparation for the procedure.  Continue Ranitidine 150 mg twice a day until your procedure.   We have given you a handout about GERD diet.

## 2014-11-03 ENCOUNTER — Ambulatory Visit (INDEPENDENT_AMBULATORY_CARE_PROVIDER_SITE_OTHER): Payer: 59 | Admitting: Internal Medicine

## 2014-11-03 ENCOUNTER — Encounter: Payer: Self-pay | Admitting: Internal Medicine

## 2014-11-03 VITALS — BP 140/90 | HR 80 | Temp 98.0°F | Resp 16 | Wt 266.0 lb

## 2014-11-03 DIAGNOSIS — L301 Dyshidrosis [pompholyx]: Secondary | ICD-10-CM | POA: Diagnosis not present

## 2014-11-03 MED ORDER — FLUOCINONIDE-E 0.05 % EX CREA: 1.0000 "application " | TOPICAL_CREAM | Freq: Two times a day (BID) | CUTANEOUS | Status: DC

## 2014-11-03 MED ORDER — DOXEPIN HCL 10 MG PO CAPS: 10.0000 mg | ORAL_CAPSULE | Freq: Every day | ORAL | Status: DC

## 2014-11-03 NOTE — Progress Notes (Signed)
Pre visit review using our clinic review tool, if applicable. No additional management support is needed unless otherwise documented below in the visit note. 

## 2014-11-03 NOTE — Patient Instructions (Signed)
Eczema Eczema, also called atopic dermatitis, is a skin disorder that causes inflammation of the skin. It causes a red rash and dry, scaly skin. The skin becomes very itchy. Eczema is generally worse during the cooler winter months and often improves with the warmth of summer. Eczema usually starts showing signs in infancy. Some children outgrow eczema, but it may last through adulthood.  CAUSES  The exact cause of eczema is not known, but it appears to run in families. People with eczema often have a family history of eczema, allergies, asthma, or hay fever. Eczema is not contagious. Flare-ups of the condition may be caused by:   Contact with something you are sensitive or allergic to.   Stress. SIGNS AND SYMPTOMS  Dry, scaly skin.   Red, itchy rash.   Itchiness. This may occur before the skin rash and may be very intense.  DIAGNOSIS  The diagnosis of eczema is usually made based on symptoms and medical history. TREATMENT  Eczema cannot be cured, but symptoms usually can be controlled with treatment and other strategies. A treatment plan might include:  Controlling the itching and scratching.   Use over-the-counter antihistamines as directed for itching. This is especially useful at night when the itching tends to be worse.   Use over-the-counter steroid creams as directed for itching.   Avoid scratching. Scratching makes the rash and itching worse. It may also result in a skin infection (impetigo) due to a break in the skin caused by scratching.   Keeping the skin well moisturized with creams every day. This will seal in moisture and help prevent dryness. Lotions that contain alcohol and water should be avoided because they can dry the skin.   Limiting exposure to things that you are sensitive or allergic to (allergens).   Recognizing situations that cause stress.   Developing a plan to manage stress.  HOME CARE INSTRUCTIONS   Only take over-the-counter or  prescription medicines as directed by your health care provider.   Do not use anything on the skin without checking with your health care provider.   Keep baths or showers short (5 minutes) in warm (not hot) water. Use mild cleansers for bathing. These should be unscented. You may add nonperfumed bath oil to the bath water. It is best to avoid soap and bubble bath.   Immediately after a bath or shower, when the skin is still damp, apply a moisturizing ointment to the entire body. This ointment should be a petroleum ointment. This will seal in moisture and help prevent dryness. The thicker the ointment, the better. These should be unscented.   Keep fingernails cut short. Children with eczema may need to wear soft gloves or mittens at night after applying an ointment.   Dress in clothes made of cotton or cotton blends. Dress lightly, because heat increases itching.   A child with eczema should stay away from anyone with fever blisters or cold sores. The virus that causes fever blisters (herpes simplex) can cause a serious skin infection in children with eczema. SEEK MEDICAL CARE IF:   Your itching interferes with sleep.   Your rash gets worse or is not better within 1 week after starting treatment.   You see pus or soft yellow scabs in the rash area.   You have a fever.   You have a rash flare-up after contact with someone who has fever blisters.    This information is not intended to replace advice given to you by your health care   provider. Make sure you discuss any questions you have with your health care provider.   Document Released: 12/24/1999 Document Revised: 10/16/2012 Document Reviewed: 07/29/2012 Elsevier Interactive Patient Education 2016 [employer]  

## 2014-11-04 ENCOUNTER — Encounter: Payer: 59 | Admitting: Internal Medicine

## 2014-11-04 NOTE — Progress Notes (Signed)
Subjective:  Patient ID: Valerie Sweeney, female    DOB: 04-26-1965  Age: 49 y.o. MRN: [id number]  CC: Acute Visit and Rash   HPI Valerie Sweeney presents for f/up on rash - today she has itchy, scaly spots on both forearms just distal to the elbows. She has photos on her camera from several weeks ago where she had urticarial wheals on the top of her torso. The wheals have resolved but now she has a new and different rash. She has tried Zyrtec and Zantac without any relief from the symptoms. She stopped taking zinc and says it did not help. She tried an elimination diet and that made the wheals go away but now she has a rash on her forearms that has not responded to the elimination diet. She is not aware of what could be causing her symptoms. She wants to see an allergist.  Outpatient Prescriptions Prior to Visit  Medication Sig Dispense Refill  . ferrous sulfate 325 (65 FE) MG tablet Take 1 tablet (325 mg total) by mouth 2 (two) times daily with a meal. 180 tablet 3  . Multiple Vitamin (MULTIVITAMIN WITH MINERALS) TABS tablet Take 1 tablet by mouth daily.    . ranitidine (ZANTAC) 150 MG tablet Take 1 tablet (150 mg total) by mouth 2 (two) times daily. 60 tablet 2  . Vitamin D, Ergocalciferol, (DRISDOL) 50000 UNITS CAPS capsule Take 1 capsule (50,000 Units total) by mouth every 7 (seven) days. 12 capsule 3  . zinc gluconate 50 MG tablet Take 1 tablet (50 mg total) by mouth daily. 90 tablet 3  . cetirizine (ZYRTEC) 10 MG tablet Take 10 mg by mouth daily.     No facility-administered medications prior to visit.    ROS Review of Systems  Constitutional: Negative.  Negative for fever, chills, diaphoresis, appetite change and fatigue.  HENT: Negative.  Negative for sinus pressure and trouble swallowing.   Eyes: Negative.   Respiratory: Negative.  Negative for cough, choking, chest tightness, shortness of breath and stridor.   Cardiovascular: Negative.  Negative for chest pain, palpitations and  leg swelling.  Gastrointestinal: Negative.  Negative for nausea, vomiting, abdominal pain, diarrhea and constipation.  Endocrine: Negative.   Genitourinary: Negative.   Musculoskeletal: Negative.  Negative for myalgias, back pain, arthralgias and neck stiffness.  Skin: Positive for rash. Negative for color change, pallor and wound.  Allergic/Immunologic: Negative.   Neurological: Negative.  Negative for dizziness.  Hematological: Negative.  Negative for adenopathy. Does not bruise/bleed easily.  Psychiatric/Behavioral: Negative.     Objective:  BP 140/90 mmHg  Pulse 80  Temp(Src) 98 F (36.7 C) (Oral)  Resp 16  Wt 266 lb (120.657 kg)  SpO2 98%  BP Readings from Last 3 Encounters:  11/03/14 140/90  09/08/14 128/90  09/08/14 108/76    Wt Readings from Last 3 Encounters:  11/03/14 266 lb (120.657 kg)  09/08/14 258 lb 6.4 oz (117.209 kg)  09/08/14 258 lb (117.028 kg)    Physical Exam  Constitutional: She is oriented to person, place, and time. No distress.  HENT:  Head: Normocephalic and atraumatic.  Mouth/Throat: Oropharynx is clear and moist. No oropharyngeal exudate.  Eyes: Conjunctivae are normal. Right eye exhibits no discharge. Left eye exhibits no discharge. No scleral icterus.  Neck: Normal range of motion. Neck supple. No JVD present. No tracheal deviation present. No thyromegaly present.  Cardiovascular: Normal rate, regular rhythm, normal heart sounds and intact distal pulses.  Exam reveals no gallop and no  friction rub.   No murmur heard. Pulmonary/Chest: Effort normal and breath sounds normal. No stridor. No respiratory distress. She has no wheezes. She has no rales. She exhibits no tenderness.  Abdominal: Soft. Bowel sounds are normal. She exhibits no distension and no mass. There is no tenderness. There is no rebound and no guarding.  Musculoskeletal: Normal range of motion. She exhibits no edema or tenderness.  Lymphadenopathy:    She has no cervical  adenopathy.  Neurological: She is oriented to person, place, and time.  Skin: Skin is warm and dry. Abrasion and rash noted. No bruising, no ecchymosis and no purpura noted. Rash is papular. Rash is not macular, not maculopapular, not nodular, not pustular, not vesicular and not urticarial. She is not diaphoretic. No erythema. No pallor.     Psychiatric: She has a normal mood and affect. Her behavior is normal. Judgment and thought content normal.  Vitals reviewed.   Lab Results  Component Value Date   WBC 8.1 09/08/2014   HGB 13.5 09/08/2014   HCT 42.5 09/08/2014   PLT 347.0 09/08/2014   GLUCOSE 82 07/02/2014   CHOL 164 09/22/2010   TRIG 126.0 09/22/2010   HDL 42.40 09/22/2010   LDLCALC 96 09/22/2010   ALT 13 07/02/2014   AST 13 07/02/2014   NA 136 07/02/2014   K 3.8 07/02/2014   CL 104 07/02/2014   CREATININE 0.58 07/02/2014   BUN 12 07/02/2014   CO2 26 07/02/2014   TSH 1.34 07/02/2014   HGBA1C 5.2 05/12/2011    No results found.  Assessment & Plan:   [name] was seen today for acute visit and rash.  Diagnoses and all orders for this visit:  Dyshidrotic eczema- her prior rash as shown on pictures today was consistent with hives, today she has a rash consistent with eczema. She wants to be allergy tested so I have sent a referral to allergy. Will start treating today's rash with topical steroid cream as well as a high potency antihistamine at bedtime. -     fluocinonide-emollient (LIDEX-E) 0.05 % cream; Apply 1 application topically 2 (two) times daily. -     doxepin (SINEQUAN) 10 MG capsule; Take 1 capsule (10 mg total) by mouth at bedtime. -     Ambulatory referral to Allergy   I have discontinued Ms. Altman's cetirizine. I am also having her start on fluocinonide-emollient and doxepin. Additionally, I am having her maintain her Vitamin D (Ergocalciferol), zinc gluconate, ferrous sulfate, multivitamin with minerals, ranitidine, and omeprazole.  Meds ordered this  encounter  Medications  . omeprazole (PRILOSEC) 20 MG capsule    Sig: Take 20 mg by mouth daily.  . fluocinonide-emollient (LIDEX-E) 0.05 % cream    Sig: Apply 1 application topically 2 (two) times daily.    Dispense:  60 g    Refill:  1  . doxepin (SINEQUAN) 10 MG capsule    Sig: Take 1 capsule (10 mg total) by mouth at bedtime.    Dispense:  90 capsule    Refill:  3     Follow-up: Return if symptoms worsen or fail to improve.  Sanda Lingerhomas Jones, MD

## 2014-12-16 ENCOUNTER — Encounter: Payer: Self-pay | Admitting: Allergy and Immunology

## 2014-12-16 ENCOUNTER — Ambulatory Visit (INDEPENDENT_AMBULATORY_CARE_PROVIDER_SITE_OTHER): Payer: 59 | Admitting: Allergy and Immunology

## 2014-12-16 VITALS — BP 124/84 | HR 88 | Temp 97.8°F | Resp 20 | Ht 65.98 in | Wt 266.0 lb

## 2014-12-16 DIAGNOSIS — H1010 Acute atopic conjunctivitis, unspecified eye: Secondary | ICD-10-CM | POA: Diagnosis not present

## 2014-12-16 DIAGNOSIS — L500 Allergic urticaria: Secondary | ICD-10-CM

## 2014-12-16 DIAGNOSIS — J309 Allergic rhinitis, unspecified: Secondary | ICD-10-CM

## 2014-12-16 LAB — CBC WITH DIFFERENTIAL/PLATELET
BASOS ABS: 0.1 10*3/uL (ref 0.0–0.1)
BASOS PCT: 1 % (ref 0–1)
EOS ABS: 0.3 10*3/uL (ref 0.0–0.7)
Eosinophils Relative: 4 % (ref 0–5)
HCT: 39.1 % (ref 36.0–46.0)
Hemoglobin: 12.5 g/dL (ref 12.0–15.0)
LYMPHS ABS: 3.2 10*3/uL (ref 0.7–4.0)
Lymphocytes Relative: 39 % (ref 12–46)
MCH: 25.8 pg — ABNORMAL LOW (ref 26.0–34.0)
MCHC: 32 g/dL (ref 30.0–36.0)
MCV: 80.8 fL (ref 78.0–100.0)
MPV: 9.2 fL (ref 8.6–12.4)
Monocytes Absolute: 0.7 10*3/uL (ref 0.1–1.0)
Monocytes Relative: 8 % (ref 3–12)
NEUTROS PCT: 48 % (ref 43–77)
Neutro Abs: 3.9 10*3/uL (ref 1.7–7.7)
PLATELETS: 335 10*3/uL (ref 150–400)
RBC: 4.84 MIL/uL (ref 3.87–5.11)
RDW: 14.1 % (ref 11.5–15.5)
WBC: 8.2 10*3/uL (ref 4.0–10.5)

## 2014-12-16 LAB — TSH: TSH: 1.355 u[IU]/mL (ref 0.350–4.500)

## 2014-12-16 NOTE — Progress Notes (Signed)
[facility]    NEW PATIENT NOTE  Referring Provider: Etta GrandchildJones, Valerie L, MD Primary Provider: Sanda Lingerhomas Jones, MD    Subjective:   Chief Complaint:  Valerie Sweeney is a 49 y.o. female with a chief complaint of Allergies; Allergy Testing; and Pruritis  who presents to the clinic with the following problems:  HPI Comments:  Valerie Sweeney presents this clinic on 12/16/2014 in evaluation of hives. For the past 3 months she's had intermittent urticaria manifested as red raised itchy lesions that lasts several days and never heal with scar or hyperpigmentation and are not associated with any systemic or constitutional symptoms other than dermatographia. There is no obvious trigger giving rise to this immune hyperreactivity. She has not had a change in her environment and she is not using any new medications other than nose medications given for her urticaria. She was initially treated with Zyrtec and Zantac but still continue to have outbreaks of hives. She's been using what sounds like doxepin every night for the past 3 weeks which has resulted in very good control of her urticaria. Since she discontinued her doxepin the past 3 days she is not had return of her urticaria but has had pruritus. She does have an atopic history with the development of nose blowing and sneezing and itchy red watery eyes over the course of the past 5 years occurring on a perennial basis without seasonal variation and without any obvious trigger. She has no associated anosmia but she does have some headaches on occasion. All this is helped by using Benadryl. She does not have a history of asthma or atopic dermatitis or other hypersensitivity.   Past Medical History  Diagnosis Date  . Hypertension   . GERD (gastroesophageal reflux disease)   . Allergy     SEASONAL ALLERGIES  . Anemia     TAKES IRON AND STATES ALWAYS BEEN ANEMIC  . Knee pain     MORE DIFFICULT TO GET UP AND  DOWN--RELATES TO HER WEIGHT  . Hiatal hernia   . Esophageal dysmotilities   . Vitamin D deficiency     Past Surgical History  Procedure Laterality Date  . Cholecystectomy    . Breath tek h pylori  01/24/2011    Procedure: BREATH TEK H PYLORI;  Surgeon: Valerie Cockingavid H Newman, MD;  [facility];  Service: General;  Laterality: N/A;  . Laparoscopic gastrectomy      sleeve    Outpatient Encounter Prescriptions as of 12/16/2014  Medication Sig  . omeprazole (PRILOSEC) 20 MG capsule Take 20 mg by mouth daily.  Marland Kitchen. doxepin (SINEQUAN) 10 MG capsule Take 1 capsule (10 mg total) by mouth at bedtime. (Patient not taking: Reported on 12/16/2014)  . ferrous sulfate 325 (65 FE) MG tablet Take 1 tablet (325 mg total) by mouth 2 (two) times daily with a meal. (Patient not taking: Reported on 12/16/2014)  . fluocinonide-emollient (LIDEX-E) 0.05 % cream Apply 1 application topically 2 (two) times daily. (Patient not taking: Reported on 12/16/2014)  . Multiple Vitamin (MULTIVITAMIN WITH MINERALS) TABS tablet Take 1 tablet by mouth daily.  . ranitidine (ZANTAC) 150 MG tablet Take 1 tablet (150 mg total) by mouth 2 (two) times daily. (Patient not taking: Reported on 12/16/2014)  . Vitamin D, Ergocalciferol, (DRISDOL) 50000 UNITS CAPS capsule Take 1 capsule (50,000 Units total) by mouth every 7 (seven) days. (Patient not taking: Reported on 12/16/2014)  . zinc gluconate 50 MG tablet Take 1  tablet (50 mg total) by mouth daily. (Patient not taking: Reported on 12/16/2014)   No facility-administered encounter medications on file as of 12/16/2014.    No orders of the defined types were placed in this encounter.    Allergies  Allergen Reactions  . Pork-Derived Products Other (See Comments)    Belief     Review of Systems  Constitutional: Negative.  Negative for fever, chills and fatigue.  HENT: Positive for congestion and sneezing. Negative for ear pain, facial swelling, hearing loss, nosebleeds, postnasal  drip, rhinorrhea, sinus pressure, sore throat, tinnitus, trouble swallowing and voice change.   Eyes: Negative for pain, discharge, redness and itching.  Respiratory: Negative for cough, chest tightness, shortness of breath and wheezing.   Cardiovascular: Negative for chest pain and leg swelling.  Gastrointestinal: Positive for nausea and abdominal pain. Negative for vomiting.       History of GERD treated successfully with daily omeprazole. History of IBS. To have colonoscopy and endoscopy next week  Endocrine: Negative for cold intolerance and heat intolerance.  Genitourinary:       Recent episode of urinary tract infection treated with antibiotics summer 2016  Musculoskeletal: Negative for myalgias and arthralgias.  Skin: Positive for rash (See history of present illness).  Allergic/Immunologic: Negative.   Neurological: Negative for dizziness and headaches.  Hematological: Negative for adenopathy. Does not bruise/bleed easily.       Iron deficiency anemia.    Family History  Problem Relation Age of Onset  . Hypertension Mother   . Stroke Father   . Hypertension Father   . Diabetes Father   . Cancer Neg Hx     Social History   Social History  . Marital Status: Married    Spouse Name: N/A  . Number of Children: N/A  . Years of Education: N/A   Occupational History  . Not on file.   Social History Main Topics  . Smoking status: Never Smoker   . Smokeless tobacco: Never Used  . Alcohol Use: No  . Drug Use: No  . Sexual Activity: Yes    Birth Control/ Protection: IUD   Other Topics Concern  . Not on file   Social History Narrative   Caffienated drinks-Yes once daily   Seat belt use often-yes   Regular Exercise-no   Smoke alarm in the home-yes   Firearms/guns in the home-no   History of physical abuse-no                Environmental and Social history  Lives in a house with a dry environment, no animals located inside the household, carpeting in the  bedroom, no plastic on the bed or pillow, and no smokers located inside the household.   Objective:   Filed Vitals:   12/16/14 0848  BP: 124/84  Pulse: 88  Temp: 97.8 F (36.6 C)  Resp: 20   Height: 5' 5.98" (167.6 cm) Weight: 266 lb (120.657 kg)  Physical Exam  Constitutional: She appears well-developed and well-nourished. No distress.  HENT:  Head: Normocephalic and atraumatic. Head is without right periorbital erythema and without left periorbital erythema.  Right Ear: Tympanic membrane, external ear and ear canal normal. No drainage or tenderness. No foreign bodies. Tympanic membrane is not injected, not scarred, not perforated, not erythematous, not retracted and not bulging. No middle ear effusion.  Left Ear: Tympanic membrane, external ear and ear canal normal. No drainage or tenderness. No foreign bodies. Tympanic membrane is not injected, not scarred, not perforated, not  erythematous, not retracted and not bulging.  No middle ear effusion.  Nose: Nose normal. No mucosal edema, rhinorrhea, nose lacerations or sinus tenderness.  No foreign bodies.  Mouth/Throat: Oropharynx is clear and moist. No oropharyngeal exudate, posterior oropharyngeal edema, posterior oropharyngeal erythema or tonsillar abscesses.  Eyes: Lids are normal. Right eye exhibits no chemosis, no discharge and no exudate. No foreign body present in the right eye. Left eye exhibits no chemosis, no discharge and no exudate. No foreign body present in the left eye. Right conjunctiva is not injected. Left conjunctiva is not injected.  Neck: Neck supple. No tracheal tenderness present. No tracheal deviation and no edema present. No thyroid mass and no thyromegaly present.  Cardiovascular: Normal rate, regular rhythm, S1 normal and S2 normal.  Exam reveals no gallop.   No murmur heard. Pulmonary/Chest: No accessory muscle usage or stridor. No respiratory distress. She has no wheezes. She has no rhonchi. She has no rales.   Abdominal: Soft. She exhibits no distension and no mass. There is no tenderness. There is no rebound and no guarding.  Musculoskeletal: She exhibits no edema or tenderness.  Lymphadenopathy:       Head (right side): No tonsillar adenopathy present.       Head (left side): No tonsillar adenopathy present.    She has no cervical adenopathy.  Neurological: She is alert.  Skin: No rash noted. She is not diaphoretic.  Psychiatric: She has a normal mood and affect. Her behavior is normal.    Diagnostics:  Allergy skin tests against foods and aeroallergens were performed. She demonstrated hypersensitivity to house dust mite.   Assessment and Plan:    1. Allergic urticaria   2. Allergic rhinoconjunctivitis      1. Allergen avoidance measures  2. Continue doxepin 10 mg one tablet at bedtime  3. Can use over-the-counter Rhinocort one spray each nostril 3-7 times per week. Takes days to work  4. Blood - CBC with differential, CMP, sed, TSH, T4, thyroid peroxidase antibody, UA  5. May add Benadryl if needed  6. Further evaluation?  7. Return to clinic in 8 weeks or earlier if problem - possible medication taper?  The etiologic agent responsible for Holy's overactive immune system is not entirely clear. We'll have her perform some screening blood tests as mentioned above to investigate for systemic disease. We will get her to perform allergen avoidance measures directed against house dust mite which may help some of her overactive immune system but certainly will help some of her rhinitic issues. I'll be contacting her with the results of her blood tests once they're available for review. Fortunately, she does appear to have a good response to doxepin and thus will continue to have her use this medication at this point in time.    Laurette Schimke, MD [facility]

## 2014-12-16 NOTE — Patient Instructions (Addendum)
  1. Allergen avoidance measures  2. Continue doxepin 10 mg one tablet at bedtime  3. Can use over-the-counter Rhinocort one spray each nostril 3-7 times per week. Takes days to work  4. Blood - CBC with differential, CMP, sed, TSH, T4, thyroid peroxidase antibody, UA  5. May add Benadryl if needed  6. Further evaluation?  7. Return to clinic in 8 weeks or earlier if problem - possible medication taper?

## 2014-12-17 LAB — COMPREHENSIVE METABOLIC PANEL
ALBUMIN: 3.7 g/dL (ref 3.6–5.1)
ALT: 9 U/L (ref 6–29)
AST: 11 U/L (ref 10–35)
Alkaline Phosphatase: 69 U/L (ref 33–115)
BILIRUBIN TOTAL: 0.2 mg/dL (ref 0.2–1.2)
BUN: 11 mg/dL (ref 7–25)
CALCIUM: 8.7 mg/dL (ref 8.6–10.2)
CO2: 24 mmol/L (ref 20–31)
Chloride: 103 mmol/L (ref 98–110)
Creat: 0.58 mg/dL (ref 0.50–1.10)
GLUCOSE: 90 mg/dL (ref 65–99)
POTASSIUM: 3.9 mmol/L (ref 3.5–5.3)
Sodium: 138 mmol/L (ref 135–146)
Total Protein: 7.3 g/dL (ref 6.1–8.1)

## 2014-12-17 LAB — URINALYSIS
BILIRUBIN URINE: NEGATIVE
Glucose, UA: NEGATIVE
KETONES UR: NEGATIVE
Leukocytes, UA: NEGATIVE
Nitrite: NEGATIVE
PROTEIN: NEGATIVE
Specific Gravity, Urine: 1.027 (ref 1.001–1.035)
pH: 5.5 (ref 5.0–8.0)

## 2014-12-17 LAB — THYROID PEROXIDASE ANTIBODY: THYROID PEROXIDASE ANTIBODY: 1 [IU]/mL (ref <9)

## 2014-12-17 LAB — SEDIMENTATION RATE: SED RATE: 40 mm/h — AB (ref 0–20)

## 2014-12-22 ENCOUNTER — Encounter: Payer: Self-pay | Admitting: Internal Medicine

## 2014-12-22 ENCOUNTER — Ambulatory Visit (AMBULATORY_SURGERY_CENTER): Payer: 59 | Admitting: Internal Medicine

## 2014-12-22 VITALS — BP 143/77 | HR 71 | Temp 97.4°F | Resp 17 | Ht 66.0 in | Wt 258.0 lb

## 2014-12-22 DIAGNOSIS — K219 Gastro-esophageal reflux disease without esophagitis: Secondary | ICD-10-CM

## 2014-12-22 DIAGNOSIS — K2100 Gastro-esophageal reflux disease with esophagitis, without bleeding: Secondary | ICD-10-CM

## 2014-12-22 DIAGNOSIS — D509 Iron deficiency anemia, unspecified: Secondary | ICD-10-CM

## 2014-12-22 DIAGNOSIS — K2950 Unspecified chronic gastritis without bleeding: Secondary | ICD-10-CM | POA: Diagnosis not present

## 2014-12-22 LAB — HM COLONOSCOPY: HM COLON: NORMAL

## 2014-12-22 MED ORDER — SODIUM CHLORIDE 0.9 % IV SOLN: 500.0000 mL | INTRAVENOUS | Status: DC

## 2014-12-22 NOTE — Op Note (Signed)
[facility] [address] [place], [id number]   ENDOSCOPY PROCEDURE REPORT  PATIENT: Valerie S[place]ey, Valerie Sweeney  MR#: [id number] BIRTHDATE: December 27, 1965 , 49  yrs. old GENDER: female ENDOSCOPIST: Beverley FiedlerJay M Chavie Kolinski, MD REFERRED BY:  Etta Grandchildhomas L Jones, M.D. PROCEDURE DATE:  12/22/2014 PROCEDURE:  EGD, diagnostic and EGD w/ biopsy ASA CLASS:     Class III INDICATIONS:  GERD, hematemesis, iron deficiency anemia. MEDICATIONS: Monitored anesthesia care and Propofol 200 mg IV TOPICAL ANESTHETIC: none  DESCRIPTION OF PROCEDURE: After the risks benefits and alternatives of the procedure were thoroughly explained, informed consent was obtained.  The LB [id number] endoscope was introduced through the mouth and advanced to the second portion of the duodenum , Without limitations.  The instrument was slowly withdrawn as the mucosa was fully examined.   ESOPHAGUS: Mild reflux esophagitis was found in the distal esophagus.  STOMACH: [place] 2 cm hiatal hernia was noted.   Evidence of gastric sleeve anatomy was found characterized as healthy in appearance. The mucosa of the stomach appeared normal.  Cold forcep biopsies were taken at the gastric body, antrum and angularis to evaluate for h.  pylori.  DUODENUM: The duodenal mucosa showed no abnormalities in the bulb and 2nd part of the duodenum.  Cold forcep biopsies were taken in the second portion.  Retroflexion was not performed.     The scope was then withdrawn from the patient and the procedure completed.  COMPLICATIONS: There were no immediate complications.  ENDOSCOPIC IMPRESSION: 1.   Reflux esophagitis in the distal esophagus 2.   2 cm hiatal hernia 3.   Intact gastric sleeve anatomy 4.   The mucosa of the stomach appeared normal; multiple biopsies 5.   The duodenal mucosa showed no abnormalities in the bulb and 2nd part of the duodenum cold forcep biopsies were taken in the second portion  RECOMMENDATIONS: 1.  Await biopsy  results 2.  Proceed with [place] Colonoscopy 3.  Continue PPI given reflux esophagitis   eSigned:  Beverley FiedlerJay M Annison Birchard, MD 12/22/2014 2:23 PM    HY:QMVHQICC:Thomas Karsten RoL Jones, MD and The Patient  PATIENT NAME:  Valerie S[place]ey, Valerie S[place]ey MR#: [id number]

## 2014-12-22 NOTE — Patient Instructions (Signed)

## 2014-12-22 NOTE — Progress Notes (Signed)
Called to room to assist during endoscopic procedure.  Patient ID and intended procedure confirmed with present staff. Received instructions for my participation in the procedure from the performing physician.  

## 2014-12-22 NOTE — Progress Notes (Signed)
Stable to RR 

## 2014-12-22 NOTE — Op Note (Signed)
[facility] [address] [place], [id number]   COLONOSCOPY PROCEDURE REPORT  PATIENT: Valerie S[place], Valerie S[place]ey  MR#: [id number] BIRTHDATE: 14-Aug-1965 , 49  yrs. old GENDER: female ENDOSCOPIST: Beverley FiedlerJay M Wellington Winegarden, MD REFERRED XB:JYNWGNBY:Thomas Karsten RoL Jones, M.D. PROCEDURE DATE:  12/22/2014 PROCEDURE:   Colonoscopy, diagnostic First Screening Colonoscopy - Avg.  risk and is 50 yrs.  old or older - No.  Prior Negative Screening - Now for repeat screening. N/[place]  History of Adenoma - Now for follow-up colonoscopy & has been > or = to 3 yrs.  N/[place]  Polyps removed today? No Recommend repeat exam, <10 yrs? No ASA CLASS:   Class III INDICATIONS:iron deficiency anemia. MEDICATIONS: Monitored anesthesia care, this was the total dose used for all procedures at this session, and Propofol 400 mg IV , lidocaine 40 mg IV  DESCRIPTION OF PROCEDURE:   After the risks benefits and alternatives of the procedure were thoroughly explained, informed consent was obtained.  The digital rectal exam revealed no abnormalities of the rectum.   The LB [id number]  endoscope was introduced through the anus and advanced to the terminal ileum. No adverse events experienced.   The quality of the prep was good, using Suprep  The instrument was then slowly withdrawn as the colon was fully examined. Estimated blood loss is zero unless otherwise noted in this procedure report.   COLON FINDINGS: Normal examined terminal ileum.  [place] normal appearing cecum, ileocecal valve, and appendiceal orifice were identified. the ascending, transverse, descending, sigmoid colon, and rectum appeared unremarkable.  Retroflexed views revealed small internal hemorrhoids. The time to cecum = 8.4 Withdrawal time = 8.6   The scope was withdrawn and the procedure completed. COMPLICATIONS: There were no complications.  ENDOSCOPIC IMPRESSION: Normal distal ileum and normal colonoscopy  RECOMMENDATIONS: 1.  Await biopsies today obtained  during upper endoscopy.  If unrevealing would recommend video capsule endoscopy to examine remaining small bowel 2.  Continue iron replacement therapy and monitoring hemoglobin and iron studies 3.  Repeat colonoscopy recommended in 10 years  eSigned:  Beverley FiedlerJay M Augusta Hilbert, MD 12/22/2014 2:26 PM   cc:  the patient, Dr. Yetta BarreJones

## 2014-12-23 ENCOUNTER — Telehealth: Payer: Self-pay

## 2014-12-23 NOTE — Telephone Encounter (Signed)
  Follow up Call-  Call back number 12/22/2014  Post procedure Call Back phone  # [phone]  Permission to leave phone message Yes     Patient questions:  Do you have a fever, pain , or abdominal swelling? No. Pain Score  0 *  Have you tolerated food without any problems? Yes.    Have you been able to return to your normal activities? Yes.    Do you have any questions about your discharge instructions: Diet   No. Medications  No. Follow up visit  No.  Do you have questions or concerns about your Care? No.  Actions: * If pain score is 4 or above: No action needed, pain <4.

## 2014-12-28 ENCOUNTER — Encounter: Payer: Self-pay | Admitting: Internal Medicine

## 2015-01-28 ENCOUNTER — Telehealth: Payer: Self-pay

## 2015-02-10 ENCOUNTER — Ambulatory Visit (INDEPENDENT_AMBULATORY_CARE_PROVIDER_SITE_OTHER): Payer: BLUE CROSS/BLUE SHIELD | Admitting: Internal Medicine

## 2015-02-10 ENCOUNTER — Encounter: Payer: Self-pay | Admitting: Internal Medicine

## 2015-02-10 VITALS — BP 126/72 | HR 84 | Temp 98.3°F | Resp 20 | Wt 269.0 lb

## 2015-02-10 DIAGNOSIS — I10 Essential (primary) hypertension: Secondary | ICD-10-CM

## 2015-02-10 DIAGNOSIS — H6983 Other specified disorders of Eustachian tube, bilateral: Secondary | ICD-10-CM

## 2015-02-10 DIAGNOSIS — H1010 Acute atopic conjunctivitis, unspecified eye: Secondary | ICD-10-CM

## 2015-02-10 DIAGNOSIS — J309 Allergic rhinitis, unspecified: Secondary | ICD-10-CM

## 2015-02-10 DIAGNOSIS — H6121 Impacted cerumen, right ear: Secondary | ICD-10-CM | POA: Insufficient documentation

## 2015-02-10 DIAGNOSIS — H918X1 Other specified hearing loss, right ear: Secondary | ICD-10-CM

## 2015-02-10 DIAGNOSIS — H6980 Other specified disorders of Eustachian tube, unspecified ear: Secondary | ICD-10-CM | POA: Insufficient documentation

## 2015-02-10 MED ORDER — TRIAMCINOLONE ACETONIDE 55 MCG/ACT NA AERO: 2.0000 | INHALATION_SPRAY | Freq: Every day | NASAL | Status: DC

## 2015-02-10 MED ORDER — CETIRIZINE HCL 10 MG PO TABS: 10.0000 mg | ORAL_TABLET | Freq: Every day | ORAL | Status: DC

## 2015-02-10 NOTE — Progress Notes (Signed)
Pre visit review using our clinic review tool, if applicable. No additional management support is needed unless otherwise documented below in the visit note. 

## 2015-02-10 NOTE — Progress Notes (Signed)
Subjective:    Patient ID: Valerie S[facility], female    DOB: 03-29-1965, 50 y.o.   MRN: [id number]  HPI  Here with 2 mo ongoing bilat ear popping, midl to mod without pain, hearing loss, HA, fever, vertigo or hearing loss, better with valsalva maneuver but only a few minutes, nothing seems to make better other than this.  No sinus pain or fever or colored d/c  Does have several wks ongoing nasal allergy symptoms with clearish congestion, itch and sneezing, without fever, pain, ST, cough, swelling or wheezing. More recently in the past 2-3 days also with worsening right hearing loss that seems to come and go, again without pain, fever or d/c. Pt denies chest pain, increased sob or doe, wheezing, orthopnea, PND, increased LE swelling, palpitations, dizziness or syncope.  Pt denies new neurological symptoms such as new headache, or facial or extremity weakness or numbness   Pt denies polydipsia, polyuria, Past Medical History  Diagnosis Date  . Hypertension   . GERD (gastroesophageal reflux disease)   . Allergy     SEASONAL ALLERGIES  . Anemia     TAKES IRON AND STATES ALWAYS BEEN ANEMIC  . Knee pain     MORE DIFFICULT TO GET UP AND DOWN--RELATES TO HER WEIGHT  . Hiatal hernia   . Esophageal dysmotilities   . Vitamin D deficiency    Past Surgical History  Procedure Laterality Date  . Cholecystectomy    . Breath tek h pylori  01/24/2011    Procedure: BREATH TEK H PYLORI;  Surgeon: Kandis Cocking, MD;  Location: [facility];  Service: General;  Laterality: N/A;  . Laparoscopic gastrectomy      sleeve    reports that she has never smoked. She has never used smokeless tobacco. She reports that she does not drink alcohol or use illicit drugs. family history includes Diabetes in her father; Hypertension in her father and mother; Stroke in her father. There is no history of Cancer or Colon cancer. Allergies  Allergen Reactions  . Pork-Derived Products Other (See Comments)    Belief     Current Outpatient Prescriptions on File Prior to Visit  Medication Sig Dispense Refill  . doxepin (SINEQUAN) 10 MG capsule Take 1 capsule (10 mg total) by mouth at bedtime. 90 capsule 3  . Multiple Vitamin (MULTIVITAMIN WITH MINERALS) TABS tablet Take 1 tablet by mouth daily.    Marland Kitchen omeprazole (PRILOSEC) 20 MG capsule Take 20 mg by mouth daily.    . [DISCONTINUED] calcium citrate-vitamin D 200-200 MG-UNIT TABS Take 1 tablet by mouth daily.     . [DISCONTINUED] Iron 66 MG TABS Take 1 tablet (66 mg total) by mouth daily. 30 tablet 11  . [DISCONTINUED] lisinopril (PRINIVIL,ZESTRIL) 20 MG tablet Take 20 mg by mouth at bedtime.     No current facility-administered medications on file prior to visit.   Review of Systems  Constitutional: Negative for unusual diaphoresis or night sweats HENT: Negative for ringing in ear or discharge Eyes: Negative for double vision or worsening visual disturbance.  Respiratory: Negative for choking and stridor.   Gastrointestinal: Negative for vomiting or other signifcant bowel change Genitourinary: Negative for hematuria or change in urine volume.  Musculoskeletal: Negative for other MSK pain or swelling Skin: Negative for color change and worsening wound.  Neurological: Negative for tremors and numbness other than noted  Psychiatric/Behavioral: Negative for decreased concentration or agitation other than above       Objective:   Physical Exam  BP 126/72 mmHg  Pulse 84  Temp(Src) 98.3 F (36.8 C) (Oral)  Resp 20  Wt 269 lb (122.018 kg)  SpO2 96% VS noted, not ill appearing Constitutional: Pt appears in no significant distress HENT: Head: NCAT.  Right Ear: External ear normal.  Left Ear: External ear normal.  Eyes: . Pupils are equal, round, and reactive to light. Conjunctivae and EOM are normal Bilat tm's with mild erythema.  Max sinus areas non tender.  Pharynx with mild erythema, no exudate  Right canal wax impaction resolved with irrigation,  hearing improved Neck: Normal range of motion. Neck supple.  Cardiovascular: Normal rate and regular rhythm.   Pulmonary/Chest: Effort normal and breath sounds without rales or wheezing.  Neurological: Pt is alert. Not confused , motor grossly intact Skin: Skin is warm. No rash, no LE edema Psychiatric: Pt behavior is normal. No agitation.     Assessment & Plan:

## 2015-02-10 NOTE — Assessment & Plan Note (Signed)
Mild to mod, Ok for zyrtec/nasacort asd,  to f/u any worsening symptoms or concerns

## 2015-02-10 NOTE — Assessment & Plan Note (Signed)
Bilat, persistent, Also for mucinex otc prn,  to f/u any worsening symptoms or concerns

## 2015-02-10 NOTE — Patient Instructions (Signed)
Your right ear wax impaction was cleared today with irrigation  Please take all new medication as prescribed - the Zyrtec and Nasacort AQ for the allergies  You should also take Mucinex or Mucinex D for the ear popping until improved  If not improved in 1-2 weeks, please call for ENT referral  Please continue all other medications as before, and refills have been done if requested.  Please have the pharmacy call with any other refills you may need.  Please keep your appointments with your specialists as you may have planned

## 2015-02-10 NOTE — Assessment & Plan Note (Signed)
stable overall by history and exam, recent data reviewed with pt, and pt to continue medical treatment as before,  to f/u any worsening symptoms or concerns BP Readings from Last 3 Encounters:  02/10/15 126/72  12/22/14 143/77  12/16/14 124/84

## 2015-02-10 NOTE — Assessment & Plan Note (Signed)
Mild, improved with irrigation of wax impaction,  to f/u any worsening symptoms or concerns

## 2015-07-23 ENCOUNTER — Other Ambulatory Visit: Payer: Self-pay | Admitting: Internal Medicine

## 2015-09-03 ENCOUNTER — Ambulatory Visit (INDEPENDENT_AMBULATORY_CARE_PROVIDER_SITE_OTHER): Payer: BLUE CROSS/BLUE SHIELD | Admitting: Internal Medicine

## 2015-09-03 ENCOUNTER — Encounter: Payer: Self-pay | Admitting: Internal Medicine

## 2015-09-03 ENCOUNTER — Other Ambulatory Visit (INDEPENDENT_AMBULATORY_CARE_PROVIDER_SITE_OTHER): Payer: BLUE CROSS/BLUE SHIELD

## 2015-09-03 VITALS — BP 110/62 | HR 88 | Temp 98.5°F | Resp 14 | Ht 66.0 in | Wt 274.1 lb

## 2015-09-03 DIAGNOSIS — R300 Dysuria: Secondary | ICD-10-CM

## 2015-09-03 DIAGNOSIS — R350 Frequency of micturition: Secondary | ICD-10-CM | POA: Diagnosis not present

## 2015-09-03 DIAGNOSIS — IMO0001 Reserved for inherently not codable concepts without codable children: Secondary | ICD-10-CM | POA: Insufficient documentation

## 2015-09-03 LAB — COMPREHENSIVE METABOLIC PANEL
ALBUMIN: 3.7 g/dL (ref 3.5–5.2)
ALK PHOS: 71 U/L (ref 39–117)
ALT: 11 U/L (ref 0–35)
AST: 12 U/L (ref 0–37)
BILIRUBIN TOTAL: 0.3 mg/dL (ref 0.2–1.2)
BUN: 9 mg/dL (ref 6–23)
CALCIUM: 8.7 mg/dL (ref 8.4–10.5)
CO2: 28 mEq/L (ref 19–32)
CREATININE: 0.63 mg/dL (ref 0.40–1.20)
Chloride: 102 mEq/L (ref 96–112)
GFR: 107.26 mL/min (ref >60.00)
Glucose, Bld: 77 mg/dL (ref 70–99)
Potassium: 3.7 mEq/L (ref 3.5–5.1)
SODIUM: 137 meq/L (ref 135–145)
TOTAL PROTEIN: 7.8 g/dL (ref 6.0–8.3)

## 2015-09-03 LAB — POCT URINALYSIS DIPSTICK
Glucose, UA: NEGATIVE
Nitrite, UA: NEGATIVE
PROTEIN UA: NEGATIVE
UROBILINOGEN UA: NEGATIVE
pH, UA: 6

## 2015-09-03 LAB — HEMOGLOBIN A1C: HEMOGLOBIN A1C: 5.4 % (ref 4.6–6.5)

## 2015-09-03 LAB — CBC
HCT: 40.3 % (ref 36.0–46.0)
Hemoglobin: 13.2 g/dL (ref 12.0–15.0)
MCHC: 32.7 g/dL (ref 30.0–36.0)
MCV: 76.5 fl — AB (ref 78.0–100.0)
PLATELETS: 372 10*3/uL (ref 150.0–400.0)
RBC: 5.26 Mil/uL — AB (ref 3.87–5.11)
RDW: 15.2 % (ref 11.5–15.5)
WBC: 8.1 10*3/uL (ref 4.0–10.5)

## 2015-09-03 LAB — TSH: TSH: 1.5 u[IU]/mL (ref 0.35–4.50)

## 2015-09-03 MED ORDER — NITROFURANTOIN MONOHYD MACRO 100 MG PO CAPS
100.0000 mg | ORAL_CAPSULE | Freq: Two times a day (BID) | ORAL | 0 refills | Status: DC
Start: 2015-09-03 — End: 2016-02-28

## 2015-09-03 NOTE — Patient Instructions (Signed)
You do have a bladder infection which we will treat with nitrofurantoin. Take 1 pill twice a day for 1 week to clear the infection.   We would also like you to go to the lab for blood work to make sure we are not missing anything with the symptoms in the feet.    Urinary Tract Infection Urinary tract infections (UTIs) can develop anywhere along your urinary tract. Your urinary tract is your body's drainage system for removing wastes and extra water. Your urinary tract includes two kidneys, two ureters, a bladder, and a urethra. Your kidneys are a pair of bean-shaped organs. Each kidney is about the size of your fist. They are located below your ribs, one on each side of your spine. CAUSES Infections are caused by microbes, which are microscopic organisms, including fungi, viruses, and bacteria. These organisms are so small that they can only be seen through a microscope. Bacteria are the microbes that most commonly cause UTIs. SYMPTOMS  Symptoms of UTIs may vary by age and gender of the patient and by the location of the infection. Symptoms in young women typically include a frequent and intense urge to urinate and a painful, burning feeling in the bladder or urethra during urination. Older women and men are more likely to be tired, shaky, and weak and have muscle aches and abdominal pain. A fever may mean the infection is in your kidneys. Other symptoms of a kidney infection include pain in your back or sides below the ribs, nausea, and vomiting. DIAGNOSIS To diagnose a UTI, your caregiver will ask you about your symptoms. Your caregiver will also ask you to provide a urine sample. The urine sample will be tested for bacteria and white blood cells. White blood cells are made by your body to help fight infection. TREATMENT  Typically, UTIs can be treated with medication. Because most UTIs are caused by a bacterial infection, they usually can be treated with the use of antibiotics. The choice of  antibiotic and length of treatment depend on your symptoms and the type of bacteria causing your infection. HOME CARE INSTRUCTIONS  If you were prescribed antibiotics, take them exactly as your caregiver instructs you. Finish the medication even if you feel better after you have only taken some of the medication.  Drink enough water and fluids to keep your urine clear or pale yellow.  Avoid caffeine, tea, and carbonated beverages. They tend to irritate your bladder.  Empty your bladder often. Avoid holding urine for long periods of time.  Empty your bladder before and after sexual intercourse.  After a bowel movement, women should cleanse from front to back. Use each tissue only once. SEEK MEDICAL CARE IF:   You have back pain.  You develop a fever.  Your symptoms do not begin to resolve within 3 days. SEEK IMMEDIATE MEDICAL CARE IF:   You have severe back pain or lower abdominal pain.  You develop chills.  You have nausea or vomiting.  You have continued burning or discomfort with urination. MAKE SURE YOU:   Understand these instructions.  Will watch your condition.  Will get help right away if you are not doing well or get worse.   This information is not intended to replace advice given to you by your health care provider. Make sure you discuss any questions you have with your health care provider.   Document Released: 10/05/2004 Document Revised: 09/16/2014 Document Reviewed: 02/03/2011 Elsevier Interactive Patient Education [employer]2016 Elsevier Inc.

## 2015-09-03 NOTE — Progress Notes (Signed)
   Subjective:    Patient ID: Valerie Sweeney, female    DOB: 09/23/1965, 50 y.o.   MRN: [id number]  HPI The patient is a 50 YO female coming in for concern of bladder infection. Having dark urine for several weeks. No pain but more urgency and frequency. She is not having abdominal pain or back pain. Also having some pain in her feet ([place] No fevers or chills. She is having dark urine and sometimes red urine. She is not on her menstrual cycle. Getting thirsty a lot in the middle of the night.   Review of Systems  Constitutional: Positive for activity change and fatigue.  Respiratory: Negative.   Cardiovascular: Negative.   Gastrointestinal: Negative.   Genitourinary: Positive for frequency and urgency. Negative for difficulty urinating, dysuria and flank pain.       Suprapelvic pressure  Musculoskeletal: Positive for arthralgias.       Pain in her feet      Objective:   Physical Exam  Constitutional: She appears well-developed and well-nourished.  HENT:  Head: Normocephalic and atraumatic.  Eyes: EOM are normal.  Cardiovascular: Normal rate and regular rhythm.   Pulmonary/Chest: Effort normal. No respiratory distress. She has no wheezes. She has no rales.  Abdominal: Soft. She exhibits no distension. There is no tenderness. There is no rebound.  No pain or flank tenderness  Skin: Skin is warm and dry.   Vitals:   09/03/15 1303  BP: 110/62  Pulse: 88  Resp: 14  Temp: 98.5 F (36.9 C)  TempSrc: Oral  SpO2: 98%  Weight: 274 lb 1.9 oz (124.3 kg)  Height: 5\' 6"  (1.676 m)      Assessment & Plan:

## 2015-09-03 NOTE — Assessment & Plan Note (Addendum)
Signs of UTI so will treat with macrobid. Also checking CMP, CBC, HgA1c, and TSH to look for other causes of the fatigue and foot pain.

## 2015-09-03 NOTE — Progress Notes (Signed)
Pre visit review using our clinic review tool, if applicable. No additional management support is needed unless otherwise documented below in the visit note. 

## 2015-09-09 ENCOUNTER — Other Ambulatory Visit: Payer: Self-pay | Admitting: Internal Medicine

## 2015-09-10 ENCOUNTER — Telehealth: Payer: Self-pay | Admitting: Emergency Medicine

## 2015-09-10 NOTE — Telephone Encounter (Signed)
Pt called and stated she was in on 8/25 for a UTI. She has finished her antibiotic but is still having symptoms. Do you want to sent her something else in or to be seen again. Please advise thanks.

## 2015-09-11 NOTE — Telephone Encounter (Signed)
I have not seen her for this so am not comfortable advising at this time

## 2015-09-14 NOTE — Telephone Encounter (Signed)
No, she needs visit.

## 2015-09-14 NOTE — Telephone Encounter (Signed)
Patient was seen on 8/25 for possible UTI. She took macrobid and finished the course and says she is still having symptoms. Patient is requesting a refill. Please advise, thanks.

## 2015-09-14 NOTE — Telephone Encounter (Signed)
Left message on voice mail informing patient that she needs to schedule an office visit in order to be seen about her uti symptoms.

## 2015-10-19 ENCOUNTER — Encounter (HOSPITAL_COMMUNITY): Payer: Self-pay

## 2015-11-05 LAB — HM PAP SMEAR

## 2015-11-05 LAB — HM MAMMOGRAPHY: HM MAMMO: NORMAL (ref 0–4)

## 2015-11-10 ENCOUNTER — Other Ambulatory Visit: Payer: Self-pay | Admitting: Internal Medicine

## 2015-11-10 DIAGNOSIS — L301 Dyshidrosis [pompholyx]: Secondary | ICD-10-CM

## 2015-11-29 ENCOUNTER — Encounter (HOSPITAL_COMMUNITY): Payer: Self-pay

## 2016-02-28 ENCOUNTER — Other Ambulatory Visit (INDEPENDENT_AMBULATORY_CARE_PROVIDER_SITE_OTHER): Payer: BLUE CROSS/BLUE SHIELD

## 2016-02-28 ENCOUNTER — Encounter: Payer: Self-pay | Admitting: Internal Medicine

## 2016-02-28 ENCOUNTER — Ambulatory Visit (INDEPENDENT_AMBULATORY_CARE_PROVIDER_SITE_OTHER): Payer: BLUE CROSS/BLUE SHIELD | Admitting: Internal Medicine

## 2016-02-28 VITALS — BP 126/60 | HR 80 | Temp 97.9°F | Resp 16 | Ht 66.0 in | Wt 284.4 lb

## 2016-02-28 DIAGNOSIS — K219 Gastro-esophageal reflux disease without esophagitis: Secondary | ICD-10-CM | POA: Diagnosis not present

## 2016-02-28 DIAGNOSIS — Z9884 Bariatric surgery status: Secondary | ICD-10-CM

## 2016-02-28 DIAGNOSIS — E60 Dietary zinc deficiency: Secondary | ICD-10-CM | POA: Diagnosis not present

## 2016-02-28 DIAGNOSIS — E559 Vitamin D deficiency, unspecified: Secondary | ICD-10-CM

## 2016-02-28 DIAGNOSIS — L301 Dyshidrosis [pompholyx]: Secondary | ICD-10-CM

## 2016-02-28 DIAGNOSIS — Z0000 Encounter for general adult medical examination without abnormal findings: Secondary | ICD-10-CM

## 2016-02-28 DIAGNOSIS — I10 Essential (primary) hypertension: Secondary | ICD-10-CM

## 2016-02-28 DIAGNOSIS — E611 Iron deficiency: Secondary | ICD-10-CM

## 2016-02-28 LAB — COMPREHENSIVE METABOLIC PANEL
ALK PHOS: 68 U/L (ref 39–117)
ALT: 10 U/L (ref 0–35)
AST: 16 U/L (ref 0–37)
Albumin: 4 g/dL (ref 3.5–5.2)
BILIRUBIN TOTAL: 0.3 mg/dL (ref 0.2–1.2)
BUN: 10 mg/dL (ref 6–23)
CALCIUM: 9.1 mg/dL (ref 8.4–10.5)
CO2: 28 mEq/L (ref 19–32)
Chloride: 102 mEq/L (ref 96–112)
Creatinine, Ser: 0.56 mg/dL (ref 0.40–1.20)
GFR: 122.63 mL/min (ref >60.00)
Glucose, Bld: 80 mg/dL (ref 70–99)
Potassium: 4.3 mEq/L (ref 3.5–5.1)
Sodium: 138 mEq/L (ref 135–145)
TOTAL PROTEIN: 8 g/dL (ref 6.0–8.3)

## 2016-02-28 LAB — IBC PANEL
Iron: 35 ug/dL — ABNORMAL LOW (ref 42–145)
SATURATION RATIOS: 8.2 % — AB (ref 20.0–50.0)
TRANSFERRIN: 305 mg/dL (ref 212.0–360.0)

## 2016-02-28 LAB — CBC WITH DIFFERENTIAL/PLATELET
BASOS ABS: 0.1 10*3/uL (ref 0.0–0.1)
Basophils Relative: 1.3 % (ref 0.0–3.0)
Eosinophils Absolute: 0.2 10*3/uL (ref 0.0–0.7)
Eosinophils Relative: 3.4 % (ref 0.0–5.0)
HCT: 37.9 % (ref 36.0–46.0)
Hemoglobin: 12.2 g/dL (ref 12.0–15.0)
LYMPHS PCT: 36.5 % (ref 12.0–46.0)
Lymphs Abs: 2.3 10*3/uL (ref 0.7–4.0)
MCHC: 32.1 g/dL (ref 30.0–36.0)
MCV: 78.4 fl (ref 78.0–100.0)
MONOS PCT: 9.2 % (ref 3.0–12.0)
Monocytes Absolute: 0.6 10*3/uL (ref 0.1–1.0)
NEUTROS PCT: 49.6 % (ref 43.0–77.0)
Neutro Abs: 3.1 10*3/uL (ref 1.4–7.7)
Platelets: 362 10*3/uL (ref 150.0–400.0)
RBC: 4.83 Mil/uL (ref 3.87–5.11)
RDW: 15.6 % — ABNORMAL HIGH (ref 11.5–15.5)
WBC: 6.2 10*3/uL (ref 4.0–10.5)

## 2016-02-28 LAB — LIPID PANEL
CHOL/HDL RATIO: 4
Cholesterol: 178 mg/dL (ref 0–200)
HDL: 45.1 mg/dL (ref >39.00)
LDL Cholesterol: 118 mg/dL — ABNORMAL HIGH (ref 0–99)
NonHDL: 132.58
TRIGLYCERIDES: 74 mg/dL (ref 0.0–149.0)
VLDL: 14.8 mg/dL (ref 0.0–40.0)

## 2016-02-28 LAB — VITAMIN D 25 HYDROXY (VIT D DEFICIENCY, FRACTURES): VITD: 12.69 ng/mL — AB (ref 30.00–100.00)

## 2016-02-28 LAB — FOLATE: Folate: 16.3 ng/mL (ref >5.9)

## 2016-02-28 LAB — VITAMIN B12: Vitamin B-12: 937 pg/mL — ABNORMAL HIGH (ref 211–911)

## 2016-02-28 LAB — TSH: TSH: 1.48 u[IU]/mL (ref 0.35–4.50)

## 2016-02-28 LAB — HEMOGLOBIN A1C: Hgb A1c MFr Bld: 5.5 % (ref 4.6–6.5)

## 2016-02-28 LAB — FERRITIN: Ferritin: 21.8 ng/mL (ref 10.0–291.0)

## 2016-02-28 MED ORDER — CETIRIZINE HCL 10 MG PO TABS: 10.0000 mg | ORAL_TABLET | Freq: Every day | ORAL | 11 refills | Status: DC

## 2016-02-28 MED ORDER — DOXEPIN HCL 10 MG PO CAPS: 10.0000 mg | ORAL_CAPSULE | Freq: Every day | ORAL | 3 refills | Status: DC

## 2016-02-28 MED ORDER — OMEPRAZOLE 20 MG PO CPDR: DELAYED_RELEASE_CAPSULE | ORAL | 11 refills | Status: DC

## 2016-02-28 NOTE — Patient Instructions (Signed)

## 2016-02-28 NOTE — Progress Notes (Signed)
Pre visit review using our clinic review tool, if applicable. No additional management support is needed unless otherwise documented below in the visit note. 

## 2016-02-28 NOTE — Progress Notes (Signed)
Subjective:  Patient ID: Valerie Sweeney, female    DOB: [birth year]  Age: 51 y.o. MRN: [id number]  CC: Annual Exam   HPI Valerie Sweeney presents for a CPX.  She is s/p bariatric sx and is not compliant with vitamin replacement therapy. She continues to take antihistamines for rash/itching. Her GERD sx's are well controlled.  Past Medical History:  Diagnosis Date  . Allergy    SEASONAL ALLERGIES  . Anemia    TAKES IRON AND STATES ALWAYS BEEN ANEMIC  . Esophageal dysmotilities   . GERD (gastroesophageal reflux disease)   . Hiatal hernia   . Hypertension   . Knee pain    MORE DIFFICULT TO GET UP AND DOWN--RELATES TO HER WEIGHT  . Vitamin D deficiency    Past Surgical History:  Procedure Laterality Date  . BREATH TEK H PYLORI  01/24/2011   Procedure: BREATH TEK H PYLORI;  Surgeon: Kandis Cocking, MD;  Location: [facility];  Service: General;  Laterality: N/A;  . CHOLECYSTECTOMY    . LAPAROSCOPIC GASTRECTOMY     sleeve    reports that she has never smoked. She has never used smokeless tobacco. She reports that she does not drink alcohol or use drugs. family history includes Diabetes in her father; Hypertension in her father and mother; Stroke in her father. Allergies  Allergen Reactions  . Pork-Derived Products Other (See Comments)    Belief      ROS Review of Systems  Constitutional: Negative.  Negative for appetite change, diaphoresis, fatigue and unexpected weight change.  HENT: Negative.   Eyes: Negative for visual disturbance.  Respiratory: Negative.  Negative for cough, chest tightness, shortness of breath and wheezing.   Cardiovascular: Negative.  Negative for chest pain and leg swelling.  Gastrointestinal: Negative.  Negative for abdominal pain, constipation, diarrhea, nausea and vomiting.  Endocrine: Negative.   Genitourinary: Negative.  Negative for decreased urine volume, difficulty urinating, dysuria, flank pain, frequency, hematuria and urgency.    Musculoskeletal: Negative for back pain, myalgias and neck pain.  Skin: Positive for rash. Negative for color change and wound.  Allergic/Immunologic: Negative.   Neurological: Negative.  Negative for dizziness, tremors, weakness, light-headedness and headaches.  Hematological: Negative.  Negative for adenopathy. Does not bruise/bleed easily.  Psychiatric/Behavioral: Negative.     Objective:  BP 126/60 (BP Location: Left Arm, Patient Position: Sitting, Cuff Size: Large)   Pulse 80   Temp 97.9 F (36.6 C) (Oral)   Resp 16   Ht 5\' 6"  (1.676 m)   Wt 284 lb 6 oz (129 kg)   SpO2 99%   BMI 45.90 kg/m   BP Readings from Last 3 Encounters:  02/28/16 126/60  09/03/15 110/62  02/10/15 126/72    Wt Readings from Last 3 Encounters:  02/28/16 284 lb 6 oz (129 kg)  09/03/15 274 lb 1.9 oz (124.3 kg)  02/10/15 269 lb (122 kg)    Physical Exam  Constitutional: She is oriented to person, place, and time. No distress.  HENT:  Mouth/Throat: Oropharynx is clear and moist. No oropharyngeal exudate.  Eyes: Conjunctivae are normal. Right eye exhibits no discharge. Left eye exhibits no discharge. No scleral icterus.  Neck: Normal range of motion. Neck supple. No JVD present. No tracheal deviation present. No thyromegaly present.  Cardiovascular: Normal rate, regular rhythm, normal heart sounds and intact distal pulses.  Exam reveals no gallop and no friction rub.   No murmur heard. Pulmonary/Chest: Effort normal and breath sounds normal. No stridor.  No respiratory distress. She has no wheezes. She has no rales. She exhibits no tenderness.  Abdominal: Soft. Bowel sounds are normal. She exhibits no distension and no mass. There is no tenderness. There is no rebound and no guarding.  Musculoskeletal: Normal range of motion. She exhibits no edema, tenderness or deformity.  Lymphadenopathy:    She has no cervical adenopathy.  Neurological: She is oriented to person, place, and time.  Skin: Skin is  warm and dry. No rash noted. She is not diaphoretic. No erythema. No pallor.  Scattered areas of xerosis, scale, eczema over BLE  Vitals reviewed.   Lab Results  Component Value Date   WBC 6.2 02/28/2016   HGB 12.2 02/28/2016   HCT 37.9 02/28/2016   PLT 362.0 02/28/2016   GLUCOSE 80 02/28/2016   CHOL 178 02/28/2016   TRIG 74.0 02/28/2016   HDL 45.10 02/28/2016   LDLCALC 118 (H) 02/28/2016   ALT 10 02/28/2016   AST 16 02/28/2016   NA 138 02/28/2016   K 4.3 02/28/2016   CL 102 02/28/2016   CREATININE 0.56 02/28/2016   BUN 10 02/28/2016   CO2 28 02/28/2016   TSH 1.48 02/28/2016   HGBA1C 5.5 02/28/2016    No results found.  Assessment & Plan:   Valerie Sweeney was seen today for annual exam.  Diagnoses and all orders for this visit:  Status post bariatric surgery- will screen for vitamin deficiencies -     Vitamin B12; Future -     IBC panel; Future -     Ferritin; Future -     Folate; Future -     Vitamin B6; Future -     Vitamin B1; Future -     Zinc; Future -     FeAsp-B12-FA-C-DSS-SuccAc-Zn (FERIVA 21/7) 75-1 MG TABS; Take 1 tablet by mouth daily. -     Cholecalciferol 2000 units TABS; Take 1 tablet (2,000 Units total) by mouth daily. -     zinc gluconate 50 MG tablet; Take 1 tablet (50 mg total) by mouth daily.  Vitamin D deficiency -     VITAMIN D 25 Hydroxy (Vit-D Deficiency, Fractures); Future -     Cholecalciferol 2000 units TABS; Take 1 tablet (2,000 Units total) by mouth daily.  Zinc deficiency -     Zinc; Future -     zinc gluconate 50 MG tablet; Take 1 tablet (50 mg total) by mouth daily.  Essential hypertension, benign- her BP is well controlled with no medications  Routine general medical examination at a health care facility - exam completed, labs reviewed, her PAP and Mammogram are UTD, she refused a flu vax, pt ed material was given -     Lipid panel; Future -     Comprehensive metabolic panel; Future -     CBC with Differential/Platelet; Future -      TSH; Future -     Hemoglobin A1c; Future  Dyshidrotic eczema -     Discontinue: cetirizine (ZYRTEC) 10 MG tablet; Take 1 tablet (10 mg total) by mouth daily. -     Discontinue: doxepin (SINEQUAN) 10 MG capsule; Take 1 capsule (10 mg total) by mouth at bedtime.  Gastroesophageal reflux disease without esophagitis -     Discontinue: omeprazole (PRILOSEC) 20 MG capsule; TAKE ONE CAPSULE BY MOUTH EVERY DAY  Iron deficiency -     FeAsp-B12-FA-C-DSS-SuccAc-Zn (FERIVA 21/7) 75-1 MG TABS; Take 1 tablet by mouth daily.   I have discontinued Valerie Sweeney's doxepin, cetirizine, triamcinolone, nitrofurantoin (  macrocrystal-monohydrate), and omeprazole. I am also having her start on FERIVA 21/7, Cholecalciferol, and zinc gluconate. Additionally, I am having her maintain her multivitamin with minerals and levonorgestrel.  Meds ordered this encounter  Medications  . levonorgestrel (MIRENA) 20 MCG/24HR IUD    Sig: 1 each by Intrauterine route once.  Marland Kitchen DISCONTD: omeprazole (PRILOSEC) 20 MG capsule    Sig: TAKE ONE CAPSULE BY MOUTH EVERY DAY    Dispense:  30 capsule    Refill:  11  . DISCONTD: cetirizine (ZYRTEC) 10 MG tablet    Sig: Take 1 tablet (10 mg total) by mouth daily.    Dispense:  30 tablet    Refill:  11  . DISCONTD: doxepin (SINEQUAN) 10 MG capsule    Sig: Take 1 capsule (10 mg total) by mouth at bedtime.    Dispense:  90 capsule    Refill:  3  . FeAsp-B12-FA-C-DSS-SuccAc-Zn (FERIVA 21/7) 75-1 MG TABS    Sig: Take 1 tablet by mouth daily.    Dispense:  28 tablet    Refill:  11  . Cholecalciferol 2000 units TABS    Sig: Take 1 tablet (2,000 Units total) by mouth daily.    Dispense:  90 tablet    Refill:  3  . zinc gluconate 50 MG tablet    Sig: Take 1 tablet (50 mg total) by mouth daily.    Dispense:  90 tablet    Refill:  3     Follow-up: Return in about 6 months (around 08/27/2016).  Sanda Linger, MD

## 2016-02-29 ENCOUNTER — Telehealth: Payer: Self-pay | Admitting: Internal Medicine

## 2016-02-29 ENCOUNTER — Encounter: Payer: Self-pay | Admitting: Internal Medicine

## 2016-02-29 DIAGNOSIS — K219 Gastro-esophageal reflux disease without esophagitis: Secondary | ICD-10-CM

## 2016-02-29 DIAGNOSIS — L301 Dyshidrosis [pompholyx]: Secondary | ICD-10-CM

## 2016-02-29 MED ORDER — OMEPRAZOLE 20 MG PO CPDR: DELAYED_RELEASE_CAPSULE | ORAL | 11 refills | Status: DC

## 2016-02-29 MED ORDER — FERIVA 21/7 75-1 MG PO TABS: 1.0000 | ORAL_TABLET | Freq: Every day | ORAL | 11 refills | Status: DC

## 2016-02-29 MED ORDER — CHOLECALCIFEROL 50 MCG (2000 UT) PO TABS: 1.0000 | ORAL_TABLET | Freq: Every day | ORAL | 3 refills | Status: DC

## 2016-02-29 MED ORDER — DOXEPIN HCL 10 MG PO CAPS: 10.0000 mg | ORAL_CAPSULE | Freq: Every day | ORAL | 3 refills | Status: DC

## 2016-02-29 MED ORDER — CETIRIZINE HCL 10 MG PO TABS: 10.0000 mg | ORAL_TABLET | Freq: Every day | ORAL | 3 refills | Status: DC

## 2016-02-29 NOTE — Telephone Encounter (Signed)
Pt called req all of her meds resend to [facility]. Please help

## 2016-02-29 NOTE — Telephone Encounter (Signed)
erx sent to [facility]

## 2016-03-01 LAB — ZINC: ZINC: 52 ug/dL — AB (ref 60–130)

## 2016-03-02 LAB — VITAMIN B1: VITAMIN B1 (THIAMINE): 11 nmol/L (ref 8–30)

## 2016-03-02 MED ORDER — ZINC GLUCONATE 50 MG PO TABS: 50.0000 mg | ORAL_TABLET | Freq: Every day | ORAL | 3 refills | Status: DC

## 2016-03-03 ENCOUNTER — Encounter: Payer: Self-pay | Admitting: Internal Medicine

## 2016-03-06 LAB — VITAMIN B6: Vitamin B6: 5.8 ng/mL (ref 2.1–21.7)

## 2016-05-27 ENCOUNTER — Emergency Department (HOSPITAL_COMMUNITY)
Admission: EM | Admit: 2016-05-27 | Discharge: 2016-05-27 | Disposition: A | Discharge status: Home / Self Care | Payer: BLUE CROSS/BLUE SHIELD | Attending: Emergency Medicine | Admitting: Emergency Medicine

## 2016-05-27 ENCOUNTER — Ambulatory Visit (INDEPENDENT_AMBULATORY_CARE_PROVIDER_SITE_OTHER): Payer: BLUE CROSS/BLUE SHIELD | Admitting: Family Medicine

## 2016-05-27 ENCOUNTER — Encounter: Payer: Self-pay | Admitting: Family Medicine

## 2016-05-27 ENCOUNTER — Emergency Department (HOSPITAL_COMMUNITY): Payer: BLUE CROSS/BLUE SHIELD

## 2016-05-27 ENCOUNTER — Other Ambulatory Visit (HOSPITAL_COMMUNITY)
Admission: RE | Admit: 2016-05-27 | Discharge: 2016-05-27 | Disposition: A | Discharge status: Home / Self Care | Payer: BLUE CROSS/BLUE SHIELD | Source: Other Acute Inpatient Hospital | Attending: Family Medicine | Admitting: Family Medicine

## 2016-05-27 ENCOUNTER — Encounter (HOSPITAL_COMMUNITY): Payer: Self-pay | Admitting: Emergency Medicine

## 2016-05-27 VITALS — BP 136/88 | HR 76 | Temp 97.7°F | Wt 279.0 lb

## 2016-05-27 DIAGNOSIS — R1084 Generalized abdominal pain: Secondary | ICD-10-CM | POA: Diagnosis not present

## 2016-05-27 DIAGNOSIS — R197 Diarrhea, unspecified: Secondary | ICD-10-CM

## 2016-05-27 DIAGNOSIS — I10 Essential (primary) hypertension: Secondary | ICD-10-CM | POA: Insufficient documentation

## 2016-05-27 DIAGNOSIS — R3915 Urgency of urination: Secondary | ICD-10-CM | POA: Diagnosis present

## 2016-05-27 DIAGNOSIS — N3000 Acute cystitis without hematuria: Secondary | ICD-10-CM | POA: Diagnosis not present

## 2016-05-27 DIAGNOSIS — R300 Dysuria: Secondary | ICD-10-CM

## 2016-05-27 DIAGNOSIS — R82998 Other abnormal findings in urine: Secondary | ICD-10-CM

## 2016-05-27 DIAGNOSIS — R8299 Other abnormal findings in urine: Secondary | ICD-10-CM | POA: Diagnosis not present

## 2016-05-27 LAB — CBC
HCT: 41.2 % (ref 36.0–46.0)
HEMOGLOBIN: 13.2 g/dL (ref 12.0–15.0)
MCH: 25.8 pg — ABNORMAL LOW (ref 26.0–34.0)
MCHC: 32 g/dL (ref 30.0–36.0)
MCV: 80.5 fL (ref 78.0–100.0)
Platelets: 358 10*3/uL (ref 150–400)
RBC: 5.12 MIL/uL — AB (ref 3.87–5.11)
RDW: 15.2 % (ref 11.5–15.5)
WBC: 8.2 10*3/uL (ref 4.0–10.5)

## 2016-05-27 LAB — COMPREHENSIVE METABOLIC PANEL
ALT: 28 U/L (ref 14–54)
ANION GAP: 9 (ref 5–15)
AST: 40 U/L (ref 15–41)
Albumin: 3.7 g/dL (ref 3.5–5.0)
Alkaline Phosphatase: 86 U/L (ref 38–126)
BILIRUBIN TOTAL: 0.4 mg/dL (ref 0.3–1.2)
BUN: 7 mg/dL (ref 6–20)
CHLORIDE: 105 mmol/L (ref 101–111)
CO2: 26 mmol/L (ref 22–32)
Calcium: 8.8 mg/dL — ABNORMAL LOW (ref 8.9–10.3)
Creatinine, Ser: 0.7 mg/dL (ref 0.44–1.00)
Glucose, Bld: 86 mg/dL (ref 65–99)
POTASSIUM: 3.2 mmol/L — AB (ref 3.5–5.1)
Sodium: 140 mmol/L (ref 135–145)
TOTAL PROTEIN: 8 g/dL (ref 6.5–8.1)

## 2016-05-27 LAB — POCT URINALYSIS DIPSTICK
BILIRUBIN UA: NEGATIVE
Blood, UA: NEGATIVE
Glucose, UA: NEGATIVE
KETONES UA: 5
Nitrite, UA: POSITIVE
Protein, UA: 15
Spec Grav, UA: >=1.03 — AB (ref 1.010–1.025)
Urobilinogen, UA: 0.2 E.U./dL
pH, UA: 6 (ref 5.0–8.0)

## 2016-05-27 LAB — POCT URINE PREGNANCY: Preg Test, Ur: NEGATIVE

## 2016-05-27 LAB — LIPASE, BLOOD: LIPASE: 23 U/L (ref 11–51)

## 2016-05-27 MED ORDER — POTASSIUM CHLORIDE CRYS ER 20 MEQ PO TBCR
30.0000 meq | EXTENDED_RELEASE_TABLET | Freq: Once | ORAL | Status: AC
  Administered 2016-05-27: 30 meq via ORAL
  Filled 2016-05-27: qty 1

## 2016-05-27 MED ORDER — ACETAMINOPHEN 325 MG PO TABS
650.0000 mg | ORAL_TABLET | Freq: Once | ORAL | Status: DC
Start: 2016-05-27 — End: 2016-05-27

## 2016-05-27 MED ORDER — SODIUM CHLORIDE 0.9 % IV BOLUS (SEPSIS)
1000.0000 mL | Freq: Once | INTRAVENOUS | Status: AC
  Administered 2016-05-27: 1000 mL via INTRAVENOUS

## 2016-05-27 MED ORDER — CIPROFLOXACIN HCL 500 MG PO TABS: 500.0000 mg | ORAL_TABLET | Freq: Two times a day (BID) | ORAL | 0 refills | Status: AC

## 2016-05-27 MED ORDER — IOPAMIDOL (ISOVUE-300) INJECTION 61%
INTRAVENOUS | Status: AC
  Filled 2016-05-27: qty 100

## 2016-05-27 NOTE — ED Provider Notes (Signed)
[facility]-EMERGENCY DEPT Provider Note   CSN: [id number] Arrival date & time: 05/27/16  1222     History   Chief Complaint Chief Complaint  Patient presents with  . Diarrhea    HPI Valerie Sweeney is a 51 y.o. female  with pertinent past medical history of hypertension, GERD, H. pylori, cholecystectomy and left gastrectomy presents to the ED with mucoid, nonbloody diarrhea associated with sharp lower abdominal pain and nausea 10 days. Patient returned from sedan yesterday and has been self treating her diarrhea with Flagyl, has taken 6 days of Flagyl today being her last day. Patient states Flagyl has not helped her diarrhea. She continues to have at least 10 BMs per day. She states the Imodium when necessary. Patient also reports dark urine, bladder pressure, urinary urgency 3-4 days. Seen at primary care provider office this morning and was sent to the ED for further workup for diarrhea or urinary symptoms. Patient usually gets one UTI per year. Denies flank pain.  Patient denies chest pain, shortness of breath, history of diverticulosis or diverticulitis.  HPI  Past Medical History:  Diagnosis Date  . Allergy    SEASONAL ALLERGIES  . Anemia    TAKES IRON AND STATES ALWAYS BEEN ANEMIC  . Esophageal dysmotilities   . GERD (gastroesophageal reflux disease)   . Hiatal hernia   . Hypertension   . Knee pain    MORE DIFFICULT TO GET UP AND DOWN--RELATES TO HER WEIGHT  . Vitamin D deficiency     Patient Active Problem List   Diagnosis Date Noted  . Allergic urticaria 12/16/2014  . Dyshidrotic eczema 11/03/2014  . Zinc deficiency 07/07/2014  . Vitamin D deficiency 07/03/2014  . Iron deficiency anemia 07/03/2014  . Status post bariatric surgery 07/02/2014  . Unspecified contraceptive management 04/25/2012  . Other abnormal glucose 04/03/2011  . DJD (degenerative joint disease) of knee 11/23/2010  . Essential hypertension, benign 09/22/2010  . Other screening mammogram 09/22/2010   . Routine general medical examination at a health care facility 09/22/2010  . GERD (gastroesophageal reflux disease) 09/22/2010    Past Surgical History:  Procedure Laterality Date  . BREATH TEK H PYLORI  01/24/2011   Procedure: BREATH TEK H PYLORI;  Surgeon: Kandis Cocking, MD;  Location: [facility];  Service: General;  Laterality: N/A;  . CHOLECYSTECTOMY    . LAPAROSCOPIC GASTRECTOMY     sleeve    OB History    No data available       Home Medications    Prior to Admission medications   Medication Sig Start Date End Date Taking? Authorizing Provider  cetirizine (ZYRTEC) 10 MG tablet Take 1 tablet (10 mg total) by mouth daily. 02/29/16  Yes Etta Grandchild, MD  Cholecalciferol 2000 units TABS Take 1 tablet (2,000 Units total) by mouth daily. 02/29/16  Yes Etta Grandchild, MD  doxepin (SINEQUAN) 10 MG capsule Take 1 capsule (10 mg total) by mouth at bedtime. 02/29/16  Yes Etta Grandchild, MD  FeAsp-B12-FA-C-DSS-SuccAc-Zn (FERIVA 21/7) 75-1 MG TABS Take 1 tablet by mouth daily. 02/29/16  Yes Etta Grandchild, MD  levonorgestrel (MIRENA, 52 MG,) 20 MCG/24HR IUD Mirena 20 mcg/24 hr (5 years) intrauterine device   Yes [provider]  omeprazole (PRILOSEC) 20 MG capsule TAKE ONE CAPSULE BY MOUTH EVERY DAY 02/29/16  Yes Etta Grandchild, MD  zinc gluconate 50 MG tablet Take 1 tablet (50 mg total) by mouth daily. 03/02/16  Yes Etta Grandchild, MD  ciprofloxacin (  CIPRO) 500 MG tablet Take 1 tablet (500 mg total) by mouth every 12 (twelve) hours. 05/27/16 06/01/16  Liberty Handy, PA-C    Family History Family History  Problem Relation Age of Onset  . Hypertension Mother   . Stroke Father   . Hypertension Father   . Diabetes Father   . Cancer Neg Hx   . Colon cancer Neg Hx     Social History Social History  Substance Use Topics  . Smoking status: Never Smoker  . Smokeless tobacco: Never Used  . Alcohol use No     Allergies   Pork-derived products   Review of  Systems Review of Systems  Constitutional: Positive for appetite change. Negative for diaphoresis, fatigue and fever.  HENT: Negative for congestion and sore throat.   Eyes: Negative for visual disturbance.  Respiratory: Negative for cough, choking and shortness of breath.   Cardiovascular: Negative for chest pain, palpitations and leg swelling.  Gastrointestinal: Positive for abdominal pain, diarrhea and nausea. Negative for blood in stool, constipation and vomiting.  Genitourinary: Positive for difficulty urinating, hematuria and urgency. Negative for dysuria, flank pain, vaginal bleeding and vaginal discharge.  Musculoskeletal: Negative for back pain.  Skin: Negative for rash.  Neurological: Negative for light-headedness and headaches.     Physical Exam Updated Vital Signs BP 139/83 (BP Location: Right Arm)   Pulse 62   Temp 98.4 F (36.9 C) (Oral)   Resp 18   SpO2 99%   Physical Exam  Constitutional: She is oriented to person, place, and time. She appears well-developed and well-nourished.  HENT:  Head: Normocephalic and atraumatic.  Nose: Nose normal.  Mouth/Throat: No oropharyngeal exudate.  Moist mucous membranes  Eyes: Conjunctivae and EOM are normal. Pupils are equal, round, and reactive to light.  Neck: Normal range of motion. Neck supple.  Cardiovascular: Normal rate, regular rhythm, normal heart sounds and intact distal pulses.   No murmur heard. Pulmonary/Chest: Effort normal and breath sounds normal. No respiratory distress. She has no wheezes. She has no rales. She exhibits no tenderness.  Abdominal: There is tenderness.  +Surgical abdominal scars noted.  +Hyperactive bowel sounds throughout.  + Diffuse lower abdominal tenderness + RLQ abdominal tenderness (+McBurney's) +Suprapubic tenderness Abdomen is soft without distention, rigidity, guarding or rebound.  No CVAT.  Negative Murphy's. Negative Psoas sign.  Non palpable kidneys. No hepatosplenomegaly.     Musculoskeletal: Normal range of motion. She exhibits no deformity.  Lymphadenopathy:    She has no cervical adenopathy.  Neurological: She is alert and oriented to person, place, and time. No sensory deficit.  Skin: Skin is warm and dry.  <2 capillary refill in fingers and toes Good skin turgor  Psychiatric: She has a normal mood and affect. Her behavior is normal. Judgment and thought content normal.  Nursing note and vitals reviewed.    ED Treatments / Results  Labs (all labs ordered are listed, but only abnormal results are displayed) Labs Reviewed  COMPREHENSIVE METABOLIC PANEL - Abnormal; Notable for the following:       Result Value   Potassium 3.2 (*)    Calcium 8.8 (*)    All other components within normal limits  CBC - Abnormal; Notable for the following:    RBC 5.12 (*)    MCH 25.8 (*)    All other components within normal limits  LIPASE, BLOOD    EKG  EKG Interpretation None       Radiology No results found.  Procedures Procedures (including  critical care time)  Medications Ordered in ED Medications  acetaminophen (TYLENOL) tablet 650 mg (650 mg Oral Not Given 05/27/16 1400)  potassium chloride (K-DUR,KLOR-CON) CR tablet 30 mEq (not administered)  iopamidol (ISOVUE-300) 61 % injection (not administered)  sodium chloride 0.9 % bolus 1,000 mL (0 mLs Intravenous Stopped 05/27/16 1637)     Initial Impression / Assessment and Plan / ED Course  I have reviewed the triage vital signs and the nursing notes.  Pertinent labs & imaging results that were available during my care of the patient were reviewed by me and considered in my medical decision making (see chart for details).   51 year old female with history of GERD, cholecystectomy, gastric sleeve presents to the ED with mucoid, nonbloody diarrhea associated with mild nausea and lower abdominal pain 10 days. Patient recently returned from sedan, has been treating her diarrhea with Flagyl which has not  been decreasing her BMs. She is using Imodium when necessary. Patient also reports urinary symptoms. She was seen at PCP office this morning, was told she had a UTI and advised to come to the ED. On exam as BP is elevated at 160, known history of hypertension. Otherwise vital signs are within normal limits. Patient is nontoxic appearing. The signs of severe dehydration. She has diffuse lower abdominal tenderness and right lower quadrant abdominal tenderness and suprapubic tenderness. Initial differential diagnosis includes infectious diarrhea, diverticulitis/diverticulosis, appendicitis being less likely. Reviewed urinalysis at PCP office (+nitrites and large leukocytes), patient has UTI. Will get basic blood work, give IVF, will consider CT scan.  Discussed plan with patient who would like to wait for blood work to re discussed need for CT scan.  He did lab work otherwise unremarkable, potassium 3.2, this was replaced in the ED. No leukocytosis, no anemia, lipase is normal, LFTs normal. Urinalysis ordered at PCP office shows positive nitrites and leukocytes consistent with patient's urinary symptoms. I recommended a CT abdomen/pelvis however patient declined, she would like antibiotics for diarrhea and to follow up with her primary care provider next week. I again explained to patient that we were unable to rule out other causes of diarrhea including diverticulosis/diverticulitis. Gave patient time to make a decision, ultimately she would like to be discharged without CT abdomen/pelvis, she will set up appointment with PCP next week for reevaluation. ED return precautions given. Patient verbalized understanding and is agreeable with plan.  Patient, ED treatment and discharge plan was discussed with supervising physician who also evaluated the patient and is agreeable with plan.   Final Clinical Impressions(s) / ED Diagnoses   Final diagnoses:  Diarrhea in adult patient  Acute cystitis without hematuria     New Prescriptions New Prescriptions   CIPROFLOXACIN (CIPRO) 500 MG TABLET    Take 1 tablet (500 mg total) by mouth every 12 (twelve) hours.     Liberty HandyGibbons, Randell Detter J, PA-C 05/27/16 1642    Little, Ambrose Finlandachel Morgan, MD 05/29/16 0900

## 2016-05-27 NOTE — Patient Instructions (Signed)
Please go to the Emergency department for further evaluation of your illness. I am concerned that you may have a more significant liver or kidney issue that needs prompt treatment.

## 2016-05-27 NOTE — Discharge Instructions (Signed)
You have a urinary tract infection.  Your blood work (including kidney function) was normal and reassuring today.  Your potassium was low, which we replaced in the ED with potassium tablet.    We recommended a CT scan to rule out other causes of diarrhea, and you declined.  It is still unclear what could be causing your diarrhea.  Please take ciprofloxacin as prescribed.  Hold off on the immodium to trend your bowel movements.  Drink plenty of water during the day (more than 2 L a day) to ensure you are not becoming dehydrated.    Make an appointment with your primary care provider within 5 days for a re-evaluation.   Return to the emergency department if your symptoms worsen, you develop worsening abdominal pain, fever, vomiting or are unable to keep anything down.

## 2016-05-27 NOTE — Progress Notes (Signed)
[facility] [address], [address] [place], [place] [address] [phone]  Date:  05/27/2016   Name:  Valerie Sweeney   DOB:  08-29-1965   MRN:  [id number]  PCP:  Etta Grandchild, MD    Chief Complaint: Dysuria (Dark urine for x 3 days. Diarrhea and abdminal pain X 10 days. Patient started taking Flagyl in [place]. Returned yesterday from [place]. )   History of Present Illness:  Valerie Sweeney is a 51 y.o. very pleasant female patient who presents with the following:  Here today with illness-  She was visiting [place] when she first got sick- she first stated to feel ill with diarrhea 10 days ago.  She did not notice any blood in her stool but did notice that her stool was slimy.   She started a week of flagyl OTC 7 days ago- she has been taking 500 mg TID and is just finishing this course  She also notes that her urine became very dark over the last 2-3 days.  She has also noted some blood in her urine.  The urine was quite dark/ brown in color and looked like iced tea.    She will have some belly pain when she has diarrhea/ cramping No vomiting but she does have nausea No fever   Never had any problems with her kidneys in the past   No new medications.   Here today with her 2 daughters  Patient Active Problem List   Diagnosis Date Noted  . Allergic urticaria 12/16/2014  . Dyshidrotic eczema 11/03/2014  . Zinc deficiency 07/07/2014  . Vitamin D deficiency 07/03/2014  . Iron deficiency anemia 07/03/2014  . Status post bariatric surgery 07/02/2014  . Unspecified contraceptive management 04/25/2012  . Other abnormal glucose 04/03/2011  . DJD (degenerative joint disease) of knee 11/23/2010  . Essential hypertension, benign 09/22/2010  . Other screening mammogram 09/22/2010  . Routine general medical examination at a health care facility 09/22/2010  . GERD (gastroesophageal reflux disease) 09/22/2010     Past Medical History:  Diagnosis Date  . Allergy    SEASONAL ALLERGIES  . Anemia    TAKES IRON AND STATES ALWAYS BEEN ANEMIC  . Esophageal dysmotilities   . GERD (gastroesophageal reflux disease)   . Hiatal hernia   . Hypertension   . Knee pain    MORE DIFFICULT TO GET UP AND DOWN--RELATES TO HER WEIGHT  . Vitamin D deficiency     Past Surgical History:  Procedure Laterality Date  . BREATH TEK H PYLORI  01/24/2011   Procedure: BREATH TEK H PYLORI;  Surgeon: Kandis Cocking, MD;  Location: [facility];  Service: General;  Laterality: N/A;  . CHOLECYSTECTOMY    . LAPAROSCOPIC GASTRECTOMY     sleeve    Social History  Substance Use Topics  . Smoking status: Never Smoker  . Smokeless tobacco: Never Used  . Alcohol use No    Family History  Problem Relation Age of Onset  . Hypertension Mother   . Stroke Father   . Hypertension Father   . Diabetes Father   . Cancer Neg Hx   . Colon cancer Neg Hx     Allergies  Allergen Reactions  . Pork-Derived Products Other (See Comments)    Belief     Medication list has been reviewed and updated.  Current Outpatient Prescriptions on File Prior to Visit  Medication Sig Dispense Refill  .  cetirizine (ZYRTEC) 10 MG tablet Take 1 tablet (10 mg total) by mouth daily. 90 tablet 3  . Cholecalciferol 2000 units TABS Take 1 tablet (2,000 Units total) by mouth daily. 90 tablet 3  . doxepin (SINEQUAN) 10 MG capsule Take 1 capsule (10 mg total) by mouth at bedtime. 90 capsule 3  . FeAsp-B12-FA-C-DSS-SuccAc-Zn (FERIVA 21/7) 75-1 MG TABS Take 1 tablet by mouth daily. 28 tablet 11  . levonorgestrel (MIRENA) 20 MCG/24HR IUD 1 each by Intrauterine route once.    . Multiple Vitamin (MULTIVITAMIN WITH MINERALS) TABS tablet Take 1 tablet by mouth daily.    Marland Kitchen. omeprazole (PRILOSEC) 20 MG capsule TAKE ONE CAPSULE BY MOUTH EVERY DAY 30 capsule 11  . zinc gluconate 50 MG tablet Take 1 tablet (50 mg total) by mouth daily. 90 tablet 3  .  [DISCONTINUED] calcium citrate-vitamin D 200-200 MG-UNIT TABS Take 1 tablet by mouth daily.     . [DISCONTINUED] Iron 66 MG TABS Take 1 tablet (66 mg total) by mouth daily. 30 tablet 11  . [DISCONTINUED] lisinopril (PRINIVIL,ZESTRIL) 20 MG tablet Take 20 mg by mouth at bedtime.     No current facility-administered medications on file prior to visit.     Review of Systems:  As per HPI- otherwise negative. Notes that she does not feel terrible but she does not feel well- feels tired, her muscles do ache   Physical Examination: Vitals:   05/27/16 1148  BP: 136/88  Pulse: 76  Temp: 97.7 F (36.5 C)   Vitals:   05/27/16 1148  Weight: 279 lb (126.6 kg)   Body mass index is 45.03 kg/m. Ideal Body Weight:    GEN: WDWN, NAD, Non-toxic, A & O x 3, obese, othewise looks well HEENT: Atraumatic, Normocephalic. Neck supple. No masses, No LAD. Ears and Nose: No external deformity. CV: RRR, No M/G/R. No JVD. No thrill. No extra heart sounds. PULM: CTA B, no wheezes, crackles, rhonchi. No retractions. No resp. distress. No accessory muscle use. ABD: S, ND, +BS. No rebound. No HSM.  Mild abd tenderness throughout  EXTR: No c/c/e NEURO Normal gait.  PSYCH: Normally interactive. Conversant. Not depressed or anxious appearing.  Calm demeanor.   Results for orders placed or performed in visit on 05/27/16  POCT Urinalysis Dipstick  Result Value Ref Range   Color, UA brown    Clarity, UA cloudy    Glucose, UA neg    Bilirubin, UA neg    Ketones, UA 5    Spec Grav, UA >=1.030 (A) 1.010 - 1.025   Blood, UA neg    pH, UA 6.0 5.0 - 8.0   Protein, UA 15    Urobilinogen, UA 0.2 0.2 or 1.0 E.U./dL   Nitrite, UA pos    Leukocytes, UA Large (3+) (A) Negative    Assessment and Plan: Dysuria - Plan: POCT Urinalysis Dipstick, Urine Culture, POCT urine pregnancy  Generalized abdominal pain - Plan: POCT Urinalysis Dipstick, Urine Culture, POCT urine pregnancy  Brown-colored urine  Here today  with concern of diarrhea, abd discomfort and tea colored urine following a recent trip to [place].  I am not able to obtain any blood work here today and feel that pt needs eval of her liver and kidney function.  They will go to the nearest ER today for treatment.  Pt and her daughters are in agreement with plan   Signed Abbe AmsterdamJessica Makyi Ledo, MD  We have sent urine culture for this pt

## 2016-05-27 NOTE — ED Notes (Signed)
Pt states that she does not want a CT, will notify PA

## 2016-05-27 NOTE — ED Triage Notes (Signed)
Pt reports ongoing diarrhea for the past several days. Recently was in [place]. Tried abx with no relief. Has also had dark urine. Went to PCP who collected urine sample, but wanted pt to get liver and kidney function labwork. No emesis. Diarrhea reduced with immodium

## 2016-05-28 LAB — URINE CULTURE

## 2016-06-02 ENCOUNTER — Ambulatory Visit (INDEPENDENT_AMBULATORY_CARE_PROVIDER_SITE_OTHER): Payer: BLUE CROSS/BLUE SHIELD | Admitting: Nurse Practitioner

## 2016-06-02 ENCOUNTER — Other Ambulatory Visit: Payer: BLUE CROSS/BLUE SHIELD

## 2016-06-02 ENCOUNTER — Encounter: Payer: Self-pay | Admitting: Nurse Practitioner

## 2016-06-02 VITALS — BP 134/90 | HR 87 | Temp 98.2°F | Ht 66.0 in | Wt 277.0 lb

## 2016-06-02 DIAGNOSIS — R300 Dysuria: Secondary | ICD-10-CM | POA: Diagnosis not present

## 2016-06-02 DIAGNOSIS — R350 Frequency of micturition: Secondary | ICD-10-CM

## 2016-06-02 LAB — POCT URINALYSIS DIPSTICK
Bilirubin, UA: NEGATIVE
Blood, UA: NEGATIVE
Glucose, UA: NEGATIVE
Ketones, UA: NEGATIVE
LEUKOCYTES UA: NEGATIVE
NITRITE UA: NEGATIVE
PH UA: 6 (ref 5.0–8.0)
PROTEIN UA: NEGATIVE
Spec Grav, UA: >=1.03 — AB (ref 1.010–1.025)
UROBILINOGEN UA: 0.2 U/dL

## 2016-06-02 NOTE — Patient Instructions (Addendum)
Push oral fluids. You will be contacted with urine culture results.  Follow up with GYN if normal urine culture.

## 2016-06-02 NOTE — Progress Notes (Signed)
Subjective:  Patient ID: Valerie Sweeney, female    DOB: January 13, 1965  Age: 51 y.o. MRN: [id number]  CC: Urinary Tract Infection (frequent urinate,burning when urinating,weird tast. not feeling better last day of cipro)   Urinary Tract Infection   This is a new problem. The current episode started 1 to 4 weeks ago. The problem occurs intermittently. The problem has been unchanged. The quality of the pain is described as burning. There has been no fever. She is not sexually active. There is no history of pyelonephritis. Associated symptoms include frequency and urgency. Pertinent negatives include no chills, discharge, flank pain, hematuria, hesitancy, nausea, possible pregnancy, sweats or vomiting. She has tried antibiotics and increased fluids for the symptoms. The treatment provided mild relief. There is no history of catheterization, kidney stones, recurrent UTIs, a single kidney, urinary stasis or a urological procedure.    Outpatient Medications Prior to Visit  Medication Sig Dispense Refill  . cetirizine (ZYRTEC) 10 MG tablet Take 1 tablet (10 mg total) by mouth daily. 90 tablet 3  . Cholecalciferol 2000 units TABS Take 1 tablet (2,000 Units total) by mouth daily. 90 tablet 3  . doxepin (SINEQUAN) 10 MG capsule Take 1 capsule (10 mg total) by mouth at bedtime. 90 capsule 3  . FeAsp-B12-FA-C-DSS-SuccAc-Zn (FERIVA 21/7) 75-1 MG TABS Take 1 tablet by mouth daily. 28 tablet 11  . levonorgestrel (MIRENA, 52 MG,) 20 MCG/24HR IUD Mirena 20 mcg/24 hr (5 years) intrauterine device    . omeprazole (PRILOSEC) 20 MG capsule TAKE ONE CAPSULE BY MOUTH EVERY DAY 30 capsule 11  . zinc gluconate 50 MG tablet Take 1 tablet (50 mg total) by mouth daily. 90 tablet 3   No facility-administered medications prior to visit.     ROS See HPI  Objective:  BP 134/90   Pulse 87   Temp 98.2 F (36.8 C)   Ht 5\' 6"  (1.676 m)   Wt 277 lb (125.6 kg)   SpO2 98%   BMI 44.71 kg/m   BP Readings from Last 3  Encounters:  06/02/16 134/90  05/27/16 139/83  05/27/16 136/88    Wt Readings from Last 3 Encounters:  06/02/16 277 lb (125.6 kg)  05/27/16 279 lb (126.6 kg)  02/28/16 284 lb 6 oz (129 kg)    Physical Exam  Constitutional: She is oriented to person, place, and time. No distress.  Abdominal: Soft. Bowel sounds are normal. She exhibits no distension. There is no tenderness.  No CVA tenderness  Neurological: She is alert and oriented to person, place, and time.  Vitals reviewed.   Lab Results  Component Value Date   WBC 8.2 05/27/2016   HGB 13.2 05/27/2016   HCT 41.2 05/27/2016   PLT 358 05/27/2016   GLUCOSE 86 05/27/2016   CHOL 178 02/28/2016   TRIG 74.0 02/28/2016   HDL 45.10 02/28/2016   LDLCALC 118 (H) 02/28/2016   ALT 28 05/27/2016   AST 40 05/27/2016   NA 140 05/27/2016   K 3.2 (L) 05/27/2016   CL 105 05/27/2016   CREATININE 0.70 05/27/2016   BUN 7 05/27/2016   CO2 26 05/27/2016   TSH 1.48 02/28/2016   HGBA1C 5.5 02/28/2016    No results found.  Assessment & Plan:   [name] was seen today for urinary tract infection.  Diagnoses and all orders for this visit:  Frequent urination -     POCT urinalysis dipstick -     Urine culture; Future  Dysuria -  POCT urinalysis dipstick -     Urine culture; Future   I have discontinued Ms. Schweitzer's Ciprofloxacin-Ciproflox HCl. I am also having her maintain her FERIVA 21/7, Cholecalciferol, cetirizine, doxepin, omeprazole, zinc gluconate, and levonorgestrel.  Meds ordered this encounter  Medications  . DISCONTD: Ciprofloxacin-Ciproflox HCl (CIPRO XR) 500 MG tablet    Sig: Take 500 mg by mouth daily.    Follow-up: Return if symptoms worsen or fail to improve.  Alysia Penna, NP

## 2016-06-03 LAB — URINE CULTURE: Organism ID, Bacteria: NO GROWTH

## 2016-06-13 ENCOUNTER — Ambulatory Visit (INDEPENDENT_AMBULATORY_CARE_PROVIDER_SITE_OTHER): Payer: BLUE CROSS/BLUE SHIELD | Admitting: Internal Medicine

## 2016-06-13 ENCOUNTER — Encounter: Payer: Self-pay | Admitting: Internal Medicine

## 2016-06-13 ENCOUNTER — Other Ambulatory Visit (INDEPENDENT_AMBULATORY_CARE_PROVIDER_SITE_OTHER): Payer: BLUE CROSS/BLUE SHIELD

## 2016-06-13 VITALS — BP 126/82 | HR 81 | Temp 97.6°F | Resp 12 | Ht 66.0 in | Wt 275.0 lb

## 2016-06-13 DIAGNOSIS — R1013 Epigastric pain: Secondary | ICD-10-CM | POA: Diagnosis not present

## 2016-06-13 DIAGNOSIS — K219 Gastro-esophageal reflux disease without esophagitis: Secondary | ICD-10-CM

## 2016-06-13 DIAGNOSIS — R197 Diarrhea, unspecified: Secondary | ICD-10-CM | POA: Diagnosis not present

## 2016-06-13 LAB — COMPREHENSIVE METABOLIC PANEL
ALBUMIN: 3.8 g/dL (ref 3.5–5.2)
ALT: 14 U/L (ref 0–35)
AST: 12 U/L (ref 0–37)
Alkaline Phosphatase: 60 U/L (ref 39–117)
BUN: 7 mg/dL (ref 6–23)
CHLORIDE: 103 meq/L (ref 96–112)
CO2: 29 meq/L (ref 19–32)
CREATININE: 0.67 mg/dL (ref 0.40–1.20)
Calcium: 9.4 mg/dL (ref 8.4–10.5)
GFR: 99.58 mL/min (ref >60.00)
GLUCOSE: 84 mg/dL (ref 70–99)
POTASSIUM: 4.4 meq/L (ref 3.5–5.1)
SODIUM: 139 meq/L (ref 135–145)
Total Bilirubin: 0.3 mg/dL (ref 0.2–1.2)
Total Protein: 7.8 g/dL (ref 6.0–8.3)

## 2016-06-13 LAB — CBC
HEMATOCRIT: 40.6 % (ref 36.0–46.0)
HEMOGLOBIN: 13 g/dL (ref 12.0–15.0)
MCHC: 32 g/dL (ref 30.0–36.0)
MCV: 80.1 fl (ref 78.0–100.0)
Platelets: 368 10*3/uL (ref 150.0–400.0)
RBC: 5.06 Mil/uL (ref 3.87–5.11)
RDW: 15.4 % (ref 11.5–15.5)
WBC: 7.6 10*3/uL (ref 4.0–10.5)

## 2016-06-13 LAB — VITAMIN B12: VITAMIN B 12: 529 pg/mL (ref 211–911)

## 2016-06-13 LAB — FERRITIN: Ferritin: 27.8 ng/mL (ref 10.0–291.0)

## 2016-06-13 MED ORDER — PANTOPRAZOLE SODIUM 40 MG PO TBEC: 40.0000 mg | DELAYED_RELEASE_TABLET | Freq: Every day | ORAL | 0 refills | Status: DC

## 2016-06-13 NOTE — Patient Instructions (Signed)
Stop taking the omeprazole and we will switch it with pantoprazole (also called protonix) to help more with the stomach acid.   We are checking the labs today to make sure that they are back to normal.   If the stomach does not feel better in 3 days or so call us back and we should get the CT scan of the stomach to make sure there are no other problems.   Think about starting a probiotic over the counter to help the bowels get back to normal.    Food Choices to Help Relieve Diarrhea, Adult When you have diarrhea, the foods you eat and your eating habits are very important. Choosing the right foods and drinks can help:  Relieve diarrhea.  Replace lost fluids and nutrients.  Prevent dehydration.  What general guidelines should I follow? Relieving diarrhea  Choose foods with less than 2 g or .07 oz. of fiber per serving.  Limit fats to less than 8 tsp (38 g or 1.34 oz.) a day.  Avoid the following: ? Foods and beverages sweetened with high-fructose corn syrup, honey, or sugar alcohols such as xylitol, sorbitol, and mannitol. ? Foods that contain a lot of fat or sugar. ? Fried, greasy, or spicy foods. ? High-fiber grains, breads, and cereals. ? Raw fruits and vegetables.  Eat foods that are rich in probiotics. These foods include dairy products such as yogurt and fermented milk products. They help increase healthy bacteria in the stomach and intestines (gastrointestinal tract, or GI tract).  If you have lactose intolerance, avoid dairy products. These may make your diarrhea worse.  Take medicine to help stop diarrhea (antidiarrheal medicine) only as told by your health care provider. Replacing nutrients  Eat small meals or snacks every 3-4 hours.  Eat bland foods, such as white rice, toast, or baked potato, until your diarrhea starts to get better. Gradually reintroduce nutrient-rich foods as tolerated or as told by your health care provider. This includes: ? Well-cooked protein  foods. ? Peeled, seeded, and soft-cooked fruits and vegetables. ? Low-fat dairy products.  Take vitamin and mineral supplements as told by your health care provider. Preventing dehydration   Start by sipping water or a special solution to prevent dehydration (oral rehydration solution, ORS). Urine that is clear or pale yellow means that you are getting enough fluid.  Try to drink at least 8-10 cups of fluid each day to help replace lost fluids.  You may add other liquids in addition to water, such as clear juice or decaffeinated sports drinks, as tolerated or as told by your health care provider.  Avoid drinks with caffeine, such as coffee, tea, or soft drinks.  Avoid alcohol. What foods are recommended? The items listed may not be a complete list. Talk with your health care provider about what dietary choices are best for you. Grains White rice. White, [place], or pita breads (fresh or toasted), including plain rolls, buns, or bagels. White pasta. Saltine, soda, or graham crackers. Pretzels. Low-fiber cereal. Cooked cereals made with water (such as cornmeal, farina, or cream cereals). Plain muffins. Matzo. Melba toast. Zwieback. Vegetables Potatoes (without the skin). Most well-cooked and canned vegetables without skins or seeds. Tender lettuce. Fruits Apple sauce. Fruits canned in juice. Cooked apricots, cherries, grapefruit, peaches, pears, or plums. Fresh bananas and cantaloupe. Meats and other protein foods Baked or boiled chicken. Eggs. Tofu. Fish. Seafood. Smooth nut butters. Ground or well-cooked tender beef, ham, veal, lamb, pork, or poultry. Dairy Plain yogurt, kefir, and  unsweetened liquid yogurt. Lactose-free milk, buttermilk, skim milk, or soy milk. Low-fat or nonfat hard cheese. Beverages Water. Low-calorie sports drinks. Fruit juices without pulp. Strained tomato and vegetable juices. Decaffeinated teas. Sugar-free beverages not sweetened with sugar alcohols. Oral  rehydration solutions, if approved by your health care provider. Seasoning and other foods Bouillon, broth, or soups made from recommended foods. What foods are not recommended? The items listed may not be a complete list. Talk with your health care provider about what dietary choices are best for you. Grains Whole grain, whole wheat, bran, or rye breads, rolls, pastas, and crackers. Wild or brown rice. Whole grain or bran cereals. Barley. Oats and oatmeal. Corn tortillas or taco shells. Granola. Popcorn. Vegetables Raw vegetables. Fried vegetables. Cabbage, broccoli, Brussels sprouts, artichokes, baked beans, beet greens, corn, kale, legumes, peas, sweet potatoes, and yams. Potato skins. Cooked spinach and cabbage. Fruits Dried fruit, including raisins and dates. Raw fruits. Stewed or dried prunes. Canned fruits with syrup. Meat and other protein foods Fried or fatty meats. Deli meats. Chunky nut butters. Nuts and seeds. Beans and lentils. Tomasa BlaseBacon. Hot dogs. Sausage. Dairy High-fat cheeses. Whole milk, chocolate milk, and beverages made with milk, such as milk shakes. Half-and-half. Cream. sour cream. Ice cream. Beverages Caffeinated beverages (such as coffee, tea, soda, or energy drinks). Alcoholic beverages. Fruit juices with pulp. Prune juice. Soft drinks sweetened with high-fructose corn syrup or sugar alcohols. High-calorie sports drinks. Fats and oils Butter. Cream sauces. Margarine. Salad oils. Plain salad dressings. Olives. Avocados. Mayonnaise. Sweets and desserts Sweet rolls, doughnuts, and sweet breads. Sugar-free desserts sweetened with sugar alcohols such as xylitol and sorbitol. Seasoning and other foods Honey. Hot sauce. Chili powder. Gravy. Cream-based or milk-based soups. Pancakes and waffles. Summary  When you have diarrhea, the foods you eat and your eating habits are very important.  Make sure you get at least 8-10 cups of fluid each day, or enough to keep your urine  clear or pale yellow.  Eat bland foods and gradually reintroduce healthy, nutrient-rich foods as tolerated, or as told by your health care provider.  Avoid high-fiber, fried, greasy, or spicy foods. This information is not intended to replace advice given to you by your health care provider. Make sure you discuss any questions you have with your health care provider. Document Released: 03/18/2003 Document Revised: 12/24/2015 Document Reviewed: 12/24/2015 Elsevier Interactive Patient Education  2017 [employer]

## 2016-06-13 NOTE — Progress Notes (Signed)
   Subjective:    Patient ID: Valerie Sweeney, female    DOB: [birth year], 51 y.o.   MRN: [id number]  HPI The patient is a 51 YO female coming in for several concerns today including diarrhea (started about 1 month ago while in [place], she took treatment there with flagyl and she added ciprofloxacin after ER visit several weeks ago and advised to get CT scan which she declined, diarrhea is improving and is still not solid but decreasing frequency of bowel movements, able to eat more foods again) and her stomach pain (she is having midepigastric pain in her stomach with eating, more heartburn that she usually has, she is taking otc tums which help slightly as well as ranitidine over the counter) and also her GERD which is worse (see above). Denies fevers or chills. Denies nausea or vomiting.   Review of Systems  Constitutional: Positive for activity change and appetite change. Negative for chills, fatigue, fever and unexpected weight change.  HENT: Negative.   Eyes: Negative.   Respiratory: Negative.   Cardiovascular: Negative.   Gastrointestinal: Positive for abdominal distention, abdominal pain and diarrhea. Negative for anal bleeding, blood in stool, constipation, nausea and vomiting.  Musculoskeletal: Negative.   Skin: Negative.   Neurological: Negative.   Hematological: Negative.   Psychiatric/Behavioral: Negative.       Objective:   Physical Exam  Constitutional: She is oriented to person, place, and time. She appears well-developed and well-nourished.  HENT:  Head: Normocephalic and atraumatic.  Eyes: EOM are normal.  Neck: Normal range of motion.  Cardiovascular: Normal rate and regular rhythm.   Pulmonary/Chest: Effort normal and breath sounds normal. No respiratory distress. She has no wheezes. She has no rales.  Abdominal: Soft. Bowel sounds are normal. She exhibits no distension. There is tenderness. There is no rebound and no guarding.  Some pain in the epigastric region, no  rebound or guarding and BS normal  Musculoskeletal: She exhibits no edema.  Neurological: She is alert and oriented to person, place, and time.  Skin: Skin is warm and dry.  Psychiatric: She has a normal mood and affect.   Vitals:   06/13/16 0756  BP: 126/82  Pulse: 81  Resp: 12  Temp: 97.6 F (36.4 C)  TempSrc: Oral  SpO2: 98%  Weight: 275 lb (124.7 kg)  Height: 5\' 6"  (1.676 m)      Assessment & Plan:

## 2016-06-16 DIAGNOSIS — R109 Unspecified abdominal pain: Secondary | ICD-10-CM | POA: Insufficient documentation

## 2016-06-16 NOTE — Assessment & Plan Note (Signed)
Suspect related to her GERD and not recent diarrhea. No signs of a ruptured diverticula. Have advised her that if pain does not improve in 2-3 days to call back office and she will need to get the CT Scan of the abdomen.

## 2016-06-16 NOTE — Assessment & Plan Note (Signed)
Currently improving and advised to add probiotic. Given recent trip out of the country would get GI pathogen panel and CT abdomen for returning diarrhea.

## 2016-06-16 NOTE — Assessment & Plan Note (Signed)
Added protonix for better management although we talked about how this can worsen especially acute diarrhea. If she has worsening diarrhea she needs to call the office and stop.

## 2016-07-09 ENCOUNTER — Other Ambulatory Visit: Payer: Self-pay | Admitting: Internal Medicine

## 2016-09-10 ENCOUNTER — Other Ambulatory Visit: Payer: Self-pay | Admitting: Family

## 2016-10-06 ENCOUNTER — Encounter: Payer: Self-pay | Admitting: Nurse Practitioner

## 2016-10-06 ENCOUNTER — Ambulatory Visit (INDEPENDENT_AMBULATORY_CARE_PROVIDER_SITE_OTHER): Payer: BLUE CROSS/BLUE SHIELD | Admitting: Nurse Practitioner

## 2016-10-06 VITALS — BP 122/90 | HR 77 | Temp 98.2°F | Ht 66.0 in | Wt 281.0 lb

## 2016-10-06 DIAGNOSIS — S86111A Strain of other muscle(s) and tendon(s) of posterior muscle group at lower leg level, right leg, initial encounter: Secondary | ICD-10-CM | POA: Diagnosis not present

## 2016-10-06 DIAGNOSIS — M25561 Pain in right knee: Secondary | ICD-10-CM | POA: Diagnosis not present

## 2016-10-06 MED ORDER — NAPROXEN 500 MG PO TABS: 500.0000 mg | ORAL_TABLET | Freq: Two times a day (BID) | ORAL | 0 refills | Status: DC | PRN

## 2016-10-06 NOTE — Progress Notes (Signed)
Subjective:  Patient ID: Valerie Sweeney, female    DOB: 1965/03/24  Age: 51 y.o. MRN: [id number]  CC: Leg Pain (right lower pain--started behind knee down toward caft muscle. going on for 3 wks. went for along walk)   Leg Pain   The incident occurred more than 1 week ago. The incident occurred at work. The injury mechanism is unknown. The pain is present in the right knee (and right calf). The quality of the pain is described as aching. The pain has been constant since onset. Pertinent negatives include no inability to bear weight, loss of motion, muscle weakness, numbness or tingling. The symptoms are aggravated by palpation, weight bearing and movement. She has tried NSAIDs and rest for the symptoms. The treatment provided moderate relief.  onset after walking fast to class. Worse with prolonged sitting and standing. No recent travel or immobilization.  Outpatient Medications Prior to Visit  Medication Sig Dispense Refill  . cetirizine (ZYRTEC) 10 MG tablet Take 1 tablet (10 mg total) by mouth daily. 90 tablet 3  . Cholecalciferol 2000 units TABS Take 1 tablet (2,000 Units total) by mouth daily. 90 tablet 3  . doxepin (SINEQUAN) 10 MG capsule Take 1 capsule (10 mg total) by mouth at bedtime. 90 capsule 3  . FeAsp-B12-FA-C-DSS-SuccAc-Zn (FERIVA 21/7) 75-1 MG TABS Take 1 tablet by mouth daily. 28 tablet 11  . levonorgestrel (MIRENA, 52 MG,) 20 MCG/24HR IUD Mirena 20 mcg/24 hr (5 years) intrauterine device    . zinc gluconate 50 MG tablet Take 1 tablet (50 mg total) by mouth daily. 90 tablet 3  . pantoprazole (PROTONIX) 40 MG tablet Take 1 tablet (40 mg total) by mouth daily. (Patient not taking: Reported on 10/06/2016) 90 tablet 1   No facility-administered medications prior to visit.     ROS See HPI  Objective:  BP 122/90   Pulse 77   Temp 98.2 F (36.8 C)   Ht  (1.676 m)   Wt 281 lb (127.5 kg)   SpO2 99%   BMI 45.35 kg/m   BP Readings from Last 3 Encounters:  10/06/16  122/90  06/13/16 126/82  06/02/16 134/90    Wt Readings from Last 3 Encounters:  10/06/16 281 lb (127.5 kg)  06/13/16 275 lb (124.7 kg)  06/02/16 277 lb (125.6 kg)    Physical Exam  Constitutional: She is oriented to person, place, and time. No distress.  Cardiovascular: Normal rate.   Pulmonary/Chest: Effort normal.  Musculoskeletal: She exhibits tenderness. She exhibits no edema or deformity.       Right knee: She exhibits normal range of motion, no swelling, no erythema and normal patellar mobility. No tenderness found. No patellar tendon tenderness noted.       Right ankle: Normal.       Right lower leg: She exhibits tenderness. She exhibits no bony tenderness, no swelling and no edema.       Right foot: Normal.  Posterior knee tenderness, no fullness or effusion  Neurological: She is alert and oriented to person, place, and time.  Skin: Skin is warm and dry. No erythema.  Psychiatric: She has a normal mood and affect. Valerie Sweeney behavior is normal.  Vitals reviewed.   Lab Results  Component Value Date   WBC 7.6 06/13/2016   HGB 13.0 06/13/2016   HCT 40.6 06/13/2016   PLT 368.0 06/13/2016   GLUCOSE 84 06/13/2016   CHOL 178 02/28/2016   TRIG 74.0 02/28/2016   HDL 45.10 02/28/2016  LDLCALC 118 (H) 02/28/2016   ALT 14 06/13/2016   AST 12 06/13/2016   NA 139 06/13/2016   K 4.4 06/13/2016   CL 103 06/13/2016   CREATININE 0.67 06/13/2016   BUN 7 06/13/2016   CO2 29 06/13/2016   TSH 1.48 02/28/2016   HGBA1C 5.5 02/28/2016    No results found.  Assessment & Plan:   Valerie Sweeney was seen today for leg pain.  Diagnoses and all orders for this visit:  Gastrocnemius strain, right, initial encounter -     Ambulatory referral to Sports Medicine -     naproxen (NAPROSYN) 500 MG tablet; Take 1 tablet (500 mg total) by mouth 2 (two) times daily between meals as needed. -     Knee brace  Acute pain of right knee   I am having Valerie Sweeney start on naproxen. I am also having Valerie Sweeney  maintain Valerie Sweeney FERIVA 21/7, Cholecalciferol, cetirizine, doxepin, zinc gluconate, levonorgestrel, pantoprazole, and omeprazole.  Meds ordered this encounter  Medications  . omeprazole (PRILOSEC) 20 MG capsule    Sig: Take 20 mg by mouth daily.    Refill:  11  . naproxen (NAPROSYN) 500 MG tablet    Sig: Take 1 tablet (500 mg total) by mouth 2 (two) times daily between meals as needed.    Dispense:  30 tablet    Refill:  0    Order Specific Question:   Supervising Provider    Answer:   Tresa Garter [[id number]]    Follow-up: Return if symptoms worsen or fail to improve.  Alysia Penna, NP

## 2016-10-06 NOTE — Patient Instructions (Addendum)
She declined oral prednisone Rx.  Alternate between warm and cold compress as needed.  Medial Head Gastrocnemius Tear Rehab Ask your health care provider which exercises are safe for you. Do exercises exactly as told by your health care provider and adjust them as directed. It is normal to feel mild stretching, pulling, tightness, or discomfort as you do these exercises, but you should stop right away if you feel sudden pain or your pain gets worse.Do not begin these exercises until told by your health care provider. Stretching and range of motion exercises These exercises warm up your muscles and joints and improve the movement and flexibility of your lower leg. These exercises also help to relieve pain and stiffness. Exercise A: Gastrocnemius stretch  1. Sit with your left / right leg extended. 2. Loop a belt or towel around the ball of your left / right foot. The ball of your foot is on the walking surface, right under your toes. 3. Hold both ends of the belt or towel. 4. Keep your left / right ankle and foot relaxed and keep your knee straight while you use the belt or towel to pull your foot and ankle toward you. Stop at the first point of resistance. 5. Hold this position for __________ seconds. Repeat __________ times. Complete this exercise __________ times a day. Exercise B: Ankle alphabet  1. Sit with your left / right leg supported at the lower leg. ? Do not rest your foot on anything. ? Make sure your foot has room to move freely. 2. Think of your left / right foot as a paintbrush, and move your foot to trace each letter of the alphabet in the air. Keep your hip and knee still while you trace. 3. Trace every letter from A to Z. Repeat __________ times. Complete this exercise __________ times a day. Strengthening exercises These exercises build strength and endurance in your lower leg. Endurance is the ability to use your muscles for a long time, even after they get  tired. Exercise C: Plantar flexors with band  1. Sit with your left / right leg extended. 2. Loop a rubber exercise band or tube around the ball of your left / right foot. The ball of your foot is on the walking surface, right under your toes. 3. While holding both ends of the band or tube, slowly point your toes downward, pushing them away from you. 4. Hold this position for __________ seconds. 5. Slowly return your foot to the starting position. Repeat __________ times. Complete this exercise __________ times a day. Exercise D: Plantar flexors, standing  1. Stand with your feet shoulder-width apart. 2. Place your hands on a wall or table to steady yourself as needed, but try not to use it very much for support. 3. Rise up on your toes. 4. If this exercise is too easy, try these options: ? Shift your weight toward your left / right leg until you feel challenged. ? If told by your health care provider, stand on your left / right foot only. 5. Hold this position for __________ seconds. Repeat __________ times. Complete this exercise __________ times a day. Exercise E: Plantar flexors, eccentric  1. Stand on the balls of your feet on the edge of a step. The ball of your foot is on the walking surface, right under your toes. 2. Place your hands on a wall or railing for balance as needed, but try not to lean on it for support. 3. Rise up on your  toes, using both legs to help. 4. Slowly shift all of your weight to your left / right foot and lift your other foot off the step. 5. Slowly lower your left / right heel so it drops below the level of the step. You will feel a slight stretch in your left / right calf. 6. Put your other foot back onto the step. Repeat __________ times. Complete this exercise __________ times a day. This information is not intended to replace advice given to you by your health care provider. Make sure you discuss any questions you have with your health care  provider. Document Released: 12/26/2004 Document Revised: 09/02/2015 Document Reviewed: 12/08/2014 Elsevier Interactive Patient Education  2018 [employer]   Medial Head Gastrocnemius Tear Medial head gastrocnemius tear, also called tennis leg, is an injury to the inner part of the calf muscle. This injury may include overstretching of the muscle or a partial or complete tear. This is a common sports injury. Calf muscle tears usually occur near the back of the knee. This often causes sudden pain and muscle weakness. What are the causes? This condition is caused by forceful stretching or strain on the calf muscle. This usually happens when you forcefully push off of your foot. It may also happen if you forcefully straighten your knee while your foot is flat on the ground. What increases the risk? The following factors may make you more likely to develop this condition:  Being female and older than age 49.  Playing sports that involve: ? Quick increases in speed and changes of direction, such as tennis and soccer. ? Jumping, such as basketball. ? Running, especially uphill or on uneven ground.  What are the signs or symptoms? Symptoms of this condition include:  Sudden pain in the back of the leg when the injury occurs. You may hear a popping sound or feel like you got hit in your calf.  Pain that is made worse by bringing your toes up toward your shin or by straightening your knee.  Pain on the inside of your calf, from your knee to your ankle.  Not being able to rise up on your toes.  Pain when pressing on your calf muscle.  Swelling of your calf.  Bruising along your calf and lower leg, down to your ankle.  Difficulty pushing off your foot when walking or using stairs.  How is this diagnosed? This condition may be diagnosed based on:  Your symptoms and medical history.  A physical exam. Your health care provider may be able to feel a lump or a defect in your  muscle.  MRI or ultrasound to determine the severity and exact location of your injury.  How is this treated? Treatment for this condition may include:  Resting the muscle and keeping weight off your leg for several days. During this time, you may use crutches or another walking device.  Using a splint to keep your ankle or knee in a stable position.  Wearing a walking boot to decrease the use of your gastrocnemius muscle.  Using a wedge under your heel to reduce stretching of your healing muscle.  Wearing a compression sleeve around your calf muscle.  Icing the muscle.  Raising (elevating) your leg when resting.  Taking medicine for pain and swelling (NSAIDs or steroids).  Doing leg exercises as told by your health care provider or physical therapist.  Follow these instructions at home: If you have a splint or compression sleeve:  Wear it as told  by your health care provider. Remove it only as told by your health care provider.  Loosen the splint if your toes tingle, become numb, or turn cold and blue.  If your splint or sleeve is not waterproof: ? Do not let it get wet. ? Protect it with a watertight covering when you take a bath or a shower.  Keep the splint or sleeve clean. Managing pain, stiffness, and swelling  If directed, put ice on the injured area: ? Put ice in a plastic bag. ? Place a towel between your skin and the bag. ? Leave the ice on for 20 minutes, 2-3 times a day.  Move your toes often to avoid stiffness and to lessen swelling.  Raise (elevate) the injured area above the level of your heart while you are sitting or lying down. Driving  Do not drive or operate heavy machinery while taking prescription pain medicine.  Ask your health care provider when it is safe to drive. Activity  Do not use the injured limb to support your body weight until your health care provider says that you can. Use crutches as told by your health care  provider.  Return gradually to your normal activities as told by your health care provider. Ask your health care provider what activities are safe for you.  Do exercises as told by your health care provider.  Return to sporting activity only as told by your health care provider or physical therapist. Full recovery may take several months. General instructions  Take over-the-counter and prescription medicines only as told by your health care provider.  Keep all follow-up visits as told by your health care provider. This is important. How is this prevented?  Warm up and stretch before being active.  Cool down and stretch after being active.  Give your body time to rest between periods of activity.  Make sure to use equipment that fits you.  Be safe and responsible while being active to avoid falls.  Maintain physical fitness, including: ? Strength. ? Flexibility. Contact a health care provider if:  Your symptoms do not improve with rest and treatment. Get help right away if:  You have swelling or redness in your calf that is getting worse. This information is not intended to replace advice given to you by your health care provider. Make sure you discuss any questions you have with your health care provider. Document Released: 12/26/2004 Document Revised: 08/31/2015 Document Reviewed: 12/08/2014 Elsevier Interactive Patient Education  [employer].

## 2016-10-09 ENCOUNTER — Encounter: Payer: Self-pay | Admitting: Family Medicine

## 2016-10-09 ENCOUNTER — Ambulatory Visit (INDEPENDENT_AMBULATORY_CARE_PROVIDER_SITE_OTHER): Payer: BLUE CROSS/BLUE SHIELD | Admitting: Family Medicine

## 2016-10-09 VITALS — BP 130/90 | HR 64 | Temp 98.0°F | Ht 66.0 in | Wt 285.0 lb

## 2016-10-09 DIAGNOSIS — S86111D Strain of other muscle(s) and tendon(s) of posterior muscle group at lower leg level, right leg, subsequent encounter: Secondary | ICD-10-CM | POA: Diagnosis not present

## 2016-10-09 DIAGNOSIS — S86111A Strain of other muscle(s) and tendon(s) of posterior muscle group at lower leg level, right leg, initial encounter: Secondary | ICD-10-CM | POA: Insufficient documentation

## 2016-10-09 NOTE — Patient Instructions (Signed)
Thank you for coming in,   Please try these exercises as long as her pain is minimal.  Please continue to wear the compression wrap.  Please follow-up with me in 3 or 4 weeks if he do not have any improvement.   Please feel free to call with any questions or concerns at any time, at [phone]. --Dr. Jordan Likes

## 2016-10-09 NOTE — Assessment & Plan Note (Signed)
Seems to improve if she is taking naproxen. Ultrasound was not revealing for any structural changes. - Continue naproxen to finish ten-day 14 days of therapy. - Counseled on depression and ice - Provided and counseled on home exercise therapy to go ahead and initiate. - If no improvement Consider formal physical therapy referral

## 2016-10-09 NOTE — Progress Notes (Signed)
Valerie Sweeney - 51 y.o. female MRN [id number]  Date of birth: 08/17/1965  SUBJECTIVE:  Including CC & ROS.  Chief Complaint  Patient presents with  . Leg Pain    Patient states that she has pain in the right leg. Patient was seen on Friday and recommended that an appointment be made to see Dr. Jordan Sweeney    Valerie Sweeney is a 51 year old female and is following up for right lower leg pain. She denies any specific tearing or popping sensation. She is walking in the rain with open toe shoes started having pain behind her knee. The pain got progressively worse until she started taking the naproxen. Since taking the naproxen the pain as a dull ache and as much improvement. She started having pain in the back of her calf. There is no radicular type symptoms. She denies any bruising. She is not started any home exercises. She has used in nature.Her symptoms initially started about 3 weeks ago.   She was seen by Valerie Sweeney on 9/28 and diagnosed with a gastrocnemius strain and started on naproxen.  Review of Systems  Musculoskeletal: Positive for myalgias. Negative for gait problem and joint swelling.  Skin: Negative for color change.  Neurological: Negative for weakness and numbness.  Hematological: Negative for adenopathy.    HISTORY: Past Medical, Surgical, Social, and Family History Reviewed & Updated per EMR.   Pertinent Historical Findings include:  Past Medical History:  Diagnosis Date  . Allergy    SEASONAL ALLERGIES  . Anemia    TAKES IRON AND STATES ALWAYS BEEN ANEMIC  . Esophageal dysmotilities   . GERD (gastroesophageal reflux disease)   . Hiatal hernia   . Hypertension   . Knee pain    MORE DIFFICULT TO GET UP AND DOWN--RELATES TO HER WEIGHT  . Vitamin D deficiency     Past Surgical History:  Procedure Laterality Date  . BREATH TEK H PYLORI  01/24/2011   Procedure: BREATH TEK H PYLORI;  Surgeon: Kandis Cocking, MD;  Location: [facility];  Service: General;  Laterality: N/A;    . CHOLECYSTECTOMY    . LAPAROSCOPIC GASTRECTOMY     sleeve    Allergies  Allergen Reactions  . Pork-Derived Products Other (See Comments)    Belief     Family History  Problem Relation Age of Onset  . Hypertension Mother   . Stroke Father   . Hypertension Father   . Diabetes Father   . Cancer Neg Hx   . Colon cancer Neg Hx      Social History   Social History  . Marital status: Married    Spouse name: N/A  . Number of children: N/A  . Years of education: N/A   Occupational History  . Not on file.   Social History Main Topics  . Smoking status: Never Smoker  . Smokeless tobacco: Never Used  . Alcohol use No  . Drug use: No  . Sexual activity: Yes    Birth control/ protection: IUD   Other Topics Concern  . Not on file   Social History Narrative   Caffienated drinks-Yes once daily   Seat belt use often-yes   Regular Exercise-no   Smoke alarm in the home-yes   Firearms/guns in the home-no   History of physical abuse-no                 PHYSICAL EXAM:  VS: BP 130/90 (BP Location: Left Arm, Patient Position: Sitting, Cuff Size: Large)  Pulse 64   Temp 98 F (36.7 C) (Oral)   Ht  (1.676 m)   Wt 285 lb (129.3 kg)   SpO2 97%   BMI 46.00 kg/m  Physical Exam Gen: NAD, alert, cooperative with exam, well-appearing ENT: normal lips, normal nasal mucosa,  Eye: normal EOM, normal conjunctiva and lids CV:  no edema, +2 pedal pulses   Resp: no accessory muscle use, non-labored,  Skin: no rashes, no areas of induration  Neuro: normal tone, normal sensation to touch Psych:  normal insight, alert and oriented MSK:  Right knee/leg: No tenderness to palpation in the posterior aspect of the knee  No tinnitus to palpation of the medial gastroc or the lateral gastroc. Able to rise on her tiptoes. Able to rise on a single leg with some pain. No obvious swelling or ecchymosis. Normal flexion and extension of the knee. Normal strength  resistance. Normal ankle plantarflexion and dorsiflexion to resistance. Plantarflexion with Valerie Sweeney test. Normal endpoints of valgus and varus stress testing. No tenderness to palpation over the medial lateral joint line. Neurovascular intact    Limited ultrasound: Right leg/knee:  No muscle tear or hematoma.  No Baker's cyst Normal Achilles   Summary: Normal exam  Ultrasound and interpretation by Valerie Gandy, MD       ASSESSMENT & PLAN:   Strain of right gastrocnemius muscle Seems to improve if she is taking naproxen. Ultrasound was not revealing for any structural changes. - Continue naproxen to finish ten-day 14 days of therapy. - Counseled on depression and ice - Provided and counseled on home exercise therapy to go ahead and initiate. - If no improvement Consider formal physical therapy referral

## 2016-10-16 ENCOUNTER — Encounter (HOSPITAL_COMMUNITY): Payer: Self-pay

## 2016-10-18 ENCOUNTER — Ambulatory Visit (INDEPENDENT_AMBULATORY_CARE_PROVIDER_SITE_OTHER): Payer: BLUE CROSS/BLUE SHIELD | Admitting: Family Medicine

## 2016-10-18 ENCOUNTER — Encounter: Payer: Self-pay | Admitting: Family Medicine

## 2016-10-18 VITALS — BP 126/74 | HR 75 | Temp 98.0°F | Ht 66.0 in | Wt 285.0 lb

## 2016-10-18 DIAGNOSIS — S86111D Strain of other muscle(s) and tendon(s) of posterior muscle group at lower leg level, right leg, subsequent encounter: Secondary | ICD-10-CM | POA: Diagnosis not present

## 2016-10-18 DIAGNOSIS — S86111A Strain of other muscle(s) and tendon(s) of posterior muscle group at lower leg level, right leg, initial encounter: Secondary | ICD-10-CM

## 2016-10-18 MED ORDER — NAPROXEN 500 MG PO TABS: 500.0000 mg | ORAL_TABLET | Freq: Two times a day (BID) | ORAL | 1 refills | Status: DC | PRN

## 2016-10-18 NOTE — Assessment & Plan Note (Addendum)
Does not appear to have any structural changes on ultrasound or exam. Does not appear to be associated with her knee. - Encouraged ice and elevation. - Can use crutch to help with ambulation - Counseled on when to do the exercises - Refilled the naproxen

## 2016-10-18 NOTE — Progress Notes (Signed)
Valerie Sweeney - 51 y.o. female MRN [id number]  Date of birth: Jun 09, 1965  SUBJECTIVE:  Including CC & ROS.  Chief Complaint  Patient presents with  . Leg Pain    Right calf not improved-felt a "pop" on 10/10/99 after walking    Valerie Sweeney is a 51 year old female is following up for right posterior lower leg pain. She has been diagnosed with a likely gastrocnemius strain. She is doing well with the naproxen but felt a pop a few days ago. She denies any swelling or ecchymosis. She did have pain with ambulation on walking up stairs. After this occurred she has been wearing a brace, and Ace wrap, and using a crutch to help with ambulation. She feels improvement with these types of therapies. She denies any weakness. The pain is localized to the back of her leg. The pain is moderate and sharp in nature.     Review of Systems  Musculoskeletal: Positive for gait problem. Negative for joint swelling.  Skin: Negative for color change.  Neurological: Negative for weakness and numbness.  Hematological: Negative for adenopathy.    HISTORY: Past Medical, Surgical, Social, and Family History Reviewed & Updated per EMR.   Pertinent Historical Findings include:  Past Medical History:  Diagnosis Date  . Allergy    SEASONAL ALLERGIES  . Anemia    TAKES IRON AND STATES ALWAYS BEEN ANEMIC  . Esophageal dysmotilities   . GERD (gastroesophageal reflux disease)   . Hiatal hernia   . Hypertension   . Knee pain    MORE DIFFICULT TO GET UP AND DOWN--RELATES TO HER WEIGHT  . Vitamin D deficiency     Past Surgical History:  Procedure Laterality Date  . BREATH TEK H PYLORI  01/24/2011   Procedure: BREATH TEK H PYLORI;  Surgeon: Kandis Cocking, MD;  Location: [facility];  Service: General;  Laterality: N/A;  . CHOLECYSTECTOMY    . LAPAROSCOPIC GASTRECTOMY     sleeve    Allergies  Allergen Reactions  . Pork-Derived Products Other (See Comments)    Belief     Family History  Problem  Relation Age of Onset  . Hypertension Mother   . Stroke Father   . Hypertension Father   . Diabetes Father   . Cancer Neg Hx   . Colon cancer Neg Hx      Social History   Social History  . Marital status: Married    Spouse name: N/A  . Number of children: N/A  . Years of education: N/A   Occupational History  . Not on file.   Social History Main Topics  . Smoking status: Never Smoker  . Smokeless tobacco: Never Used  . Alcohol use No  . Drug use: No  . Sexual activity: Yes    Birth control/ protection: IUD   Other Topics Concern  . Not on file   Social History Narrative   Caffienated drinks-Yes once daily   Seat belt use often-yes   Regular Exercise-no   Smoke alarm in the home-yes   Firearms/guns in the home-no   History of physical abuse-no                 PHYSICAL EXAM:  VS: BP 126/74 (BP Location: Left Arm, Patient Position: Sitting, Cuff Size: Large)   Pulse 75   Temp 98 F (36.7 C) (Oral)   Ht  (1.676 m)   Wt 285 lb (129.3 kg)   SpO2 99%   BMI 46.00 kg/m  Physical Exam Gen: NAD, alert, cooperative with exam, well-appearing ENT: normal lips, normal nasal mucosa,  Eye: normal EOM, normal conjunctiva and lids CV:  no edema, +2 pedal pulses   Resp: no accessory muscle use, non-labored,  Skin: no rashes, no areas of induration  Neuro: normal tone, normal sensation to touch Psych:  normal insight, alert and oriented MSK:  Right leg: No abnormal swelling or ecchymosis. Some tenderness to palpation of the posterior gastroc. Normal strength with dorsiflexion and plantarflexion. Some tenderness to palpation over the lateral gastrocnemius. No knee effusion.   no tenderness to palpation of the mediolateral joint line   negative McMurray's test. Neurovascular intact   Limited ultrasound: Right calf:  No significant effusion. Normal-appearing Achilles tendon in transverse view  Summary: Normal exam  Ultrasound and interpretation by  Clare Gandy, MD      ASSESSMENT & PLAN:   Strain of right gastrocnemius muscle Does not appear to have any structural changes on ultrasound or exam. Does not appear to be associated with her knee. - Encouraged ice and elevation. - Can use crutch to help with ambulation - Counseled on when to do the exercises - Refilled the naproxen

## 2016-10-18 NOTE — Patient Instructions (Signed)
Thank you for coming in,   Please the exercises once your pain is 2/10 in a couple of weeks.   Please try to move your legs when you are on the flight.    Please feel free to call with any questions or concerns at any time, at [phone]. --Dr. Jordan Likes

## 2017-01-23 ENCOUNTER — Encounter: Payer: Self-pay | Admitting: Family Medicine

## 2017-01-23 ENCOUNTER — Ambulatory Visit: Payer: BLUE CROSS/BLUE SHIELD | Admitting: Family Medicine

## 2017-01-23 VITALS — BP 128/78 | HR 89 | Temp 97.8°F | Ht 66.0 in | Wt 269.0 lb

## 2017-01-23 DIAGNOSIS — M1711 Unilateral primary osteoarthritis, right knee: Secondary | ICD-10-CM | POA: Diagnosis not present

## 2017-01-23 NOTE — Progress Notes (Signed)
Valerie Sweeney - 52 y.o. female MRN [id number]  Date of birth: 05-24-1965  SUBJECTIVE:  Including CC & ROS.  Chief Complaint  Patient presents with  . Right knee pain    Valerie Sweeney is a 52 y.o. female that is following up with right knee pain. Patient states the pain is now located on the medial aspect of her right knee. Pain is intermittent throbbing pain. Pain is worse when laying in bed. She has been doing exercises provided. Denies tingling or numbness. Pain is mild to moderate. Has tried naproxen. Pain is localized. No radicular symptoms. Pain is worse with walking up or down stairs. No prior injury.     Review of Systems  Constitutional: Negative for fever.  Respiratory: Negative for chest tightness.   Cardiovascular: Negative for chest pain.  Gastrointestinal: Negative for abdominal pain.  Musculoskeletal: Negative for joint swelling.  Skin: Negative for color change.  Neurological: Negative for weakness.  Hematological: Negative for adenopathy.  Psychiatric/Behavioral: Negative for agitation.    HISTORY: Past Medical, Surgical, Social, and Family History Reviewed & Updated per EMR.   Pertinent Historical Findings include:  Past Medical History:  Diagnosis Date  . Allergy    SEASONAL ALLERGIES  . Anemia    TAKES IRON AND STATES ALWAYS BEEN ANEMIC  . Esophageal dysmotilities   . GERD (gastroesophageal reflux disease)   . Hiatal hernia   . Hypertension   . Knee pain    MORE DIFFICULT TO GET UP AND DOWN--RELATES TO HER WEIGHT  . Vitamin D deficiency     Past Surgical History:  Procedure Laterality Date  . BREATH TEK H PYLORI  01/24/2011   Procedure: BREATH TEK H PYLORI;  Surgeon: Kandis Cocking, MD;  Location: [facility];  Service: General;  Laterality: N/A;  . CHOLECYSTECTOMY    . LAPAROSCOPIC GASTRECTOMY     sleeve    Allergies  Allergen Reactions  . Pork-Derived Products Other (See Comments)    Belief     Family History  Problem Relation Age of  Onset  . Hypertension Mother   . Stroke Father   . Hypertension Father   . Diabetes Father   . Cancer Neg Hx   . Colon cancer Neg Hx      Social History   Socioeconomic History  . Marital status: Married    Spouse name: Not on file  . Number of children: Not on file  . Years of education: Not on file  . Highest education level: Not on file  Social Needs  . Financial resource strain: Not on file  . Food insecurity - worry: Not on file  . Food insecurity - inability: Not on file  . Transportation needs - medical: Not on file  . Transportation needs - non-medical: Not on file  Occupational History  . Not on file  Tobacco Use  . Smoking status: Never Smoker  . Smokeless tobacco: Never Used  Substance and Sexual Activity  . Alcohol use: No  . Drug use: No  . Sexual activity: Yes    Birth control/protection: IUD  Other Topics Concern  . Not on file  Social History Narrative   Caffienated drinks-Yes once daily   Seat belt use often-yes   Regular Exercise-no   Smoke alarm in the home-yes   Firearms/guns in the home-no   History of physical abuse-no                 PHYSICAL EXAM:  VS: BP 128/78 (BP Location:  Left Arm, Patient Position: Sitting, Cuff Size: Normal)   Pulse 89   Temp 97.8 F (36.6 C) (Oral)   Ht 5\' 6"  (1.676 m)   Wt 269 lb (122 kg)   SpO2 99%   BMI 43.42 kg/m  Physical Exam Gen: NAD, alert, cooperative with exam, well-appearing ENT: normal lips, normal nasal mucosa,  Eye: normal EOM, normal conjunctiva and lids CV:  no edema, +2 pedal pulses   Resp: no accessory muscle use, non-labored,  Skin: no rashes, no areas of induration  Neuro: normal tone, normal sensation to touch Psych:  normal insight, alert and oriented MSK:  Right knee:  No obvious effusion  Normal flexion and extension  Normal strength to resistance  No instability with valgus and varus testing.  No pain with patellar grind.  TTp of the medial joint line  Neurovascularly  intact.    Limited ultrasound: right knee:  No effusion  Medial joint space narrowing    Summary: degenerative changes in medial joint line.   Ultrasound and interpretation by Clare GandyJeremy Heman Que, MD         ASSESSMENT & PLAN:   DJD (degenerative joint disease) of knee Pain likely related to OA of the knee. Occurring on the medial joint line. No obvious effusion.  - xrays - counseled on HEP  - if no improvement can consider injection

## 2017-01-23 NOTE — Patient Instructions (Signed)
Take tylenol 650 mg three times a day is the best evidence based medicine we have for arthritis.   Glucosamine sulfate 750mg twice a day is a supplement that has been shown to help moderate to severe arthritis.  Vitamin D 2000 IU daily  Fish oil 2 grams daily.  Tumeric 500mg twice daily.   Capsaicin topically up to four times a day may also help with pain.  Cortisone injections are an option if these interventions do not seem to make a difference or need more relief.   If cortisone injections do not help, there are different types of shots that may help but they take longer to take effect.  We can discuss this at follow up.   It's important that you continue to stay active.  Controlling your weight is important.   Consider physical therapy to strengthen muscles around the joint that hurts to take pressure off of the joint itself.  Shoe inserts with good arch support may be helpful.  [employer] orthotics at [facility] [employer] could help.   Water aerobics and cycling with low resistance are the best two types of exercise for arthritis.   

## 2017-01-23 NOTE — Assessment & Plan Note (Signed)
Pain likely related to OA of the knee. Occurring on the medial joint line. No obvious effusion.  - xrays - counseled on HEP  - if no improvement can consider injection

## 2017-02-12 ENCOUNTER — Other Ambulatory Visit: Payer: Self-pay | Admitting: Internal Medicine

## 2017-02-12 DIAGNOSIS — L301 Dyshidrosis [pompholyx]: Secondary | ICD-10-CM

## 2017-02-12 MED ORDER — DOXEPIN HCL 10 MG PO CAPS: 10.0000 mg | ORAL_CAPSULE | Freq: Every day | ORAL | 0 refills | Status: DC

## 2017-02-13 ENCOUNTER — Other Ambulatory Visit: Payer: Self-pay | Admitting: Internal Medicine

## 2017-02-13 DIAGNOSIS — L301 Dyshidrosis [pompholyx]: Secondary | ICD-10-CM

## 2017-02-28 ENCOUNTER — Telehealth: Payer: Self-pay

## 2017-02-28 DIAGNOSIS — L301 Dyshidrosis [pompholyx]: Secondary | ICD-10-CM

## 2017-02-28 MED ORDER — CETIRIZINE HCL 10 MG PO TABS: 10.0000 mg | ORAL_TABLET | Freq: Every day | ORAL | 3 refills | Status: DC

## 2017-02-28 NOTE — Telephone Encounter (Signed)
Pt came in 02/27/2017 and requested refill of zyrtec. Erx sent to [facility].

## 2017-03-22 ENCOUNTER — Other Ambulatory Visit: Payer: Self-pay | Admitting: Internal Medicine

## 2017-03-22 DIAGNOSIS — K219 Gastro-esophageal reflux disease without esophagitis: Secondary | ICD-10-CM

## 2017-03-29 ENCOUNTER — Other Ambulatory Visit: Payer: Self-pay | Admitting: Internal Medicine

## 2017-03-29 ENCOUNTER — Encounter: Payer: Self-pay | Admitting: Internal Medicine

## 2017-03-29 ENCOUNTER — Ambulatory Visit (INDEPENDENT_AMBULATORY_CARE_PROVIDER_SITE_OTHER): Payer: BLUE CROSS/BLUE SHIELD | Admitting: Internal Medicine

## 2017-03-29 ENCOUNTER — Other Ambulatory Visit (INDEPENDENT_AMBULATORY_CARE_PROVIDER_SITE_OTHER): Payer: BLUE CROSS/BLUE SHIELD

## 2017-03-29 VITALS — BP 130/80 | HR 74 | Temp 98.1°F | Resp 16 | Ht 66.0 in | Wt 277.2 lb

## 2017-03-29 DIAGNOSIS — R7309 Other abnormal glucose: Secondary | ICD-10-CM

## 2017-03-29 DIAGNOSIS — Z0000 Encounter for general adult medical examination without abnormal findings: Secondary | ICD-10-CM

## 2017-03-29 DIAGNOSIS — Z9884 Bariatric surgery status: Secondary | ICD-10-CM

## 2017-03-29 DIAGNOSIS — D508 Other iron deficiency anemias: Secondary | ICD-10-CM | POA: Diagnosis not present

## 2017-03-29 DIAGNOSIS — E559 Vitamin D deficiency, unspecified: Secondary | ICD-10-CM

## 2017-03-29 DIAGNOSIS — I10 Essential (primary) hypertension: Secondary | ICD-10-CM

## 2017-03-29 DIAGNOSIS — E60 Dietary zinc deficiency: Secondary | ICD-10-CM

## 2017-03-29 DIAGNOSIS — L301 Dyshidrosis [pompholyx]: Secondary | ICD-10-CM

## 2017-03-29 LAB — HEMOGLOBIN A1C: HEMOGLOBIN A1C: 5.5 % (ref 4.6–6.5)

## 2017-03-29 LAB — CBC WITH DIFFERENTIAL/PLATELET
BASOS ABS: 0.1 10*3/uL (ref 0.0–0.1)
Basophils Relative: 0.9 % (ref 0.0–3.0)
EOS ABS: 0.2 10*3/uL (ref 0.0–0.7)
Eosinophils Relative: 3.9 % (ref 0.0–5.0)
HCT: 38.5 % (ref 36.0–46.0)
HEMOGLOBIN: 12.2 g/dL (ref 12.0–15.0)
LYMPHS ABS: 2.2 10*3/uL (ref 0.7–4.0)
Lymphocytes Relative: 40.4 % (ref 12.0–46.0)
MCHC: 31.7 g/dL (ref 30.0–36.0)
MCV: 77.9 fl — ABNORMAL LOW (ref 78.0–100.0)
MONO ABS: 0.5 10*3/uL (ref 0.1–1.0)
Monocytes Relative: 8.5 % (ref 3.0–12.0)
NEUTROS PCT: 46.3 % (ref 43.0–77.0)
Neutro Abs: 2.5 10*3/uL (ref 1.4–7.7)
Platelets: 309 10*3/uL (ref 150.0–400.0)
RBC: 4.95 Mil/uL (ref 3.87–5.11)
RDW: 17 % — ABNORMAL HIGH (ref 11.5–15.5)
WBC: 5.5 10*3/uL (ref 4.0–10.5)

## 2017-03-29 LAB — COMPREHENSIVE METABOLIC PANEL
ALBUMIN: 3.8 g/dL (ref 3.5–5.2)
ALK PHOS: 63 U/L (ref 39–117)
ALT: 9 U/L (ref 0–35)
AST: 10 U/L (ref 0–37)
BILIRUBIN TOTAL: 0.2 mg/dL (ref 0.2–1.2)
BUN: 7 mg/dL (ref 6–23)
CO2: 27 mEq/L (ref 19–32)
Calcium: 9.2 mg/dL (ref 8.4–10.5)
Chloride: 103 mEq/L (ref 96–112)
Creatinine, Ser: 0.61 mg/dL (ref 0.40–1.20)
GFR: 110.6 mL/min (ref >60.00)
Glucose, Bld: 94 mg/dL (ref 70–99)
Potassium: 3.9 mEq/L (ref 3.5–5.1)
SODIUM: 138 meq/L (ref 135–145)
TOTAL PROTEIN: 7.2 g/dL (ref 6.0–8.3)

## 2017-03-29 LAB — LIPID PANEL
CHOL/HDL RATIO: 4
Cholesterol: 141 mg/dL (ref 0–200)
HDL: 40.1 mg/dL (ref >39.00)
LDL Cholesterol: 78 mg/dL (ref 0–99)
NonHDL: 101.11
TRIGLYCERIDES: 116 mg/dL (ref 0.0–149.0)
VLDL: 23.2 mg/dL (ref 0.0–40.0)

## 2017-03-29 LAB — FOLATE: FOLATE: 6.3 ng/mL (ref >5.9)

## 2017-03-29 LAB — VITAMIN D 25 HYDROXY (VIT D DEFICIENCY, FRACTURES): VITD: 12.81 ng/mL — AB (ref 30.00–100.00)

## 2017-03-29 LAB — FERRITIN: Ferritin: 11 ng/mL (ref 10.0–291.0)

## 2017-03-29 LAB — IBC PANEL
IRON: 55 ug/dL (ref 42–145)
Saturation Ratios: 13.5 % — ABNORMAL LOW (ref 20.0–50.0)
Transferrin: 290 mg/dL (ref 212.0–360.0)

## 2017-03-29 LAB — VITAMIN B12: Vitamin B-12: 223 pg/mL (ref 211–911)

## 2017-03-29 MED ORDER — DOXEPIN HCL 10 MG PO CAPS: 10.0000 mg | ORAL_CAPSULE | Freq: Every day | ORAL | 1 refills | Status: DC

## 2017-03-29 MED ORDER — CHOLECALCIFEROL 50 MCG (2000 UT) PO TABS: 1.0000 | ORAL_TABLET | Freq: Every day | ORAL | 3 refills | Status: DC

## 2017-03-29 NOTE — Patient Instructions (Signed)

## 2017-03-29 NOTE — Progress Notes (Signed)
Subjective:  Patient ID: Valerie Sweeney, female    DOB: [birth year]  Age: 52 y.o. MRN: [id number]  CC: Hypertension; Anemia; and Annual Exam   HPI [name] presents for a CPX.  She complains of fatigue and weight gain.  She has not taken very good care of herself over the last year.  She has not been compliant with her vitamin replacement therapy.  Outpatient Medications Prior to Visit  Medication Sig Dispense Refill  . cetirizine (ZYRTEC) 10 MG tablet Take 1 tablet (10 mg total) by mouth daily. 90 tablet 3  . FeAsp-B12-FA-C-DSS-SuccAc-Zn (FERIVA 21/7) 75-1 MG TABS Take 1 tablet by mouth daily. 28 tablet 11  . levonorgestrel (MIRENA, 52 MG,) 20 MCG/24HR IUD Mirena 20 mcg/24 hr (5 years) intrauterine device    . zinc gluconate 50 MG tablet Take 1 tablet (50 mg total) by mouth daily. 90 tablet 3  . Cholecalciferol 2000 units TABS Take 1 tablet (2,000 Units total) by mouth daily. 90 tablet 3  . doxepin (SINEQUAN) 10 MG capsule Take 1 capsule (10 mg total) by mouth at bedtime. 90 capsule 0  . naproxen (NAPROSYN) 500 MG tablet Take 1 tablet (500 mg total) by mouth 2 (two) times daily between meals as needed. 30 tablet 1  . omeprazole (PRILOSEC) 20 MG capsule Take 20 mg by mouth daily.  11  . omeprazole (PRILOSEC) 20 MG capsule TAKE ONE CAPSULE BY MOUTH EVERY DAY 30 capsule 2   No facility-administered medications prior to visit.     ROS Review of Systems  Constitutional: Positive for fatigue and unexpected weight change (wt gain). Negative for appetite change, chills, diaphoresis and fever.  HENT: Negative.  Negative for sinus pressure and trouble swallowing.   Eyes: Negative.  Negative for visual disturbance.  Respiratory: Negative.  Negative for cough, chest tightness, shortness of breath and wheezing.   Cardiovascular: Negative for chest pain, palpitations and leg swelling.  Gastrointestinal: Negative.  Negative for abdominal pain, constipation, diarrhea, nausea and vomiting.    Endocrine: Negative.   Genitourinary: Negative.  Negative for difficulty urinating, dysuria, flank pain and frequency.  Musculoskeletal: Negative.  Negative for arthralgias, back pain, myalgias and neck pain.  Skin: Negative for color change, pallor and rash.  Allergic/Immunologic: Negative.   Neurological: Negative.  Negative for dizziness, weakness, light-headedness and numbness.  Hematological: Negative for adenopathy. Does not bruise/bleed easily.  Psychiatric/Behavioral: Negative.     Objective:  BP 130/80 (BP Location: Left Arm, Patient Position: Sitting, Cuff Size: Large)   Pulse 74   Temp 98.1 F (36.7 C) (Oral)   Resp 16   Ht 5\' 6"  (1.676 m)   Wt 277 lb 4 oz (125.8 kg)   SpO2 99%   BMI 44.75 kg/m   BP Readings from Last 3 Encounters:  03/29/17 130/80  01/23/17 128/78  10/18/16 126/74    Wt Readings from Last 3 Encounters:  03/29/17 277 lb 4 oz (125.8 kg)  01/23/17 269 lb (122 kg)  10/18/16 285 lb (129.3 kg)    Physical Exam  Constitutional: She is oriented to person, place, and time. No distress.  HENT:  Mouth/Throat: Oropharynx is clear and moist. No oropharyngeal exudate.  Eyes: Conjunctivae are normal. Left eye exhibits no discharge. No scleral icterus.  Neck: Normal range of motion. Neck supple. No JVD present. No thyromegaly present.  Cardiovascular: Normal rate, regular rhythm and normal heart sounds. Exam reveals no gallop and no friction rub.  No murmur heard. Pulmonary/Chest: Effort normal and breath  sounds normal. No respiratory distress. She has no wheezes. She has no rales.  Abdominal: Soft. Bowel sounds are normal. She exhibits no distension and no mass. There is no tenderness. There is no guarding.  Musculoskeletal: Normal range of motion. She exhibits no edema, tenderness or deformity.  Lymphadenopathy:    She has no cervical adenopathy.  Neurological: She is alert and oriented to person, place, and time.  Skin: Skin is warm and dry. No rash  noted. She is not diaphoretic. No erythema. No pallor.  Psychiatric: She has a normal mood and affect. Her behavior is normal. Judgment and thought content normal.  Vitals reviewed.   Lab Results  Component Value Date   WBC 5.5 03/29/2017   HGB 12.2 03/29/2017   HCT 38.5 03/29/2017   PLT 309.0 03/29/2017   GLUCOSE 94 03/29/2017   CHOL 141 03/29/2017   TRIG 116.0 03/29/2017   HDL 40.10 03/29/2017   LDLCALC 78 03/29/2017   ALT 9 03/29/2017   AST 10 03/29/2017   NA 138 03/29/2017   K 3.9 03/29/2017   CL 103 03/29/2017   CREATININE 0.61 03/29/2017   BUN 7 03/29/2017   CO2 27 03/29/2017   TSH 1.19 03/29/2017   HGBA1C 5.5 03/29/2017    No results found.  Assessment & Plan:   Vlasta was seen today for hypertension, anemia and annual exam.  Diagnoses and all orders for this visit:  Essential hypertension, benign- Her blood pressure is adequately well controlled.  Electrolytes and renal function are normal. -     Comprehensive metabolic panel; Future -     Thyroid Panel With TSH; Future  Other iron deficiency anemia- Her H&H are normal.  Her iron level is normal.  I have asked her to continue iron replacement therapy. -     IBC panel; Future -     Ferritin; Future -     CBC with Differential/Platelet; Future  Status post bariatric surgery- I will check her labs to screen for the development of vitamin deficiencies. -     IBC panel; Future -     Vitamin B1; Future -     VITAMIN D 25 Hydroxy (Vit-D Deficiency, Fractures); Future -     Zinc; Future -     Folate; Future -     Vitamin B12; Future -     Cholecalciferol 2000 units TABS; Take 1 tablet (2,000 Units total) by mouth daily.  Vitamin D deficiency -     Cholecalciferol 2000 units TABS; Take 1 tablet (2,000 Units total) by mouth daily.  Zinc deficiency -     CBC with Differential/Platelet; Future  Routine general medical examination at a health care facility- Exam completed, labs reviewed, vaccines reviewed and  updated, Pap smear and mammogram are up-to-date.  Patient education material was given. -     Lipid panel; Future  Other abnormal glucose -     Hemoglobin A1c; Future   I have discontinued Kynleigh A. Warnell's omeprazole, naproxen, and omeprazole. I am also having her maintain her FERIVA 21/7, zinc gluconate, levonorgestrel, cetirizine, and Cholecalciferol.  Meds ordered this encounter  Medications  . Cholecalciferol 2000 units TABS    Sig: Take 1 tablet (2,000 Units total) by mouth daily.    Dispense:  90 tablet    Refill:  3     Follow-up: Return in about 1 year (around 03/30/2018).  Sanda Linger, MD

## 2017-03-30 ENCOUNTER — Encounter: Payer: Self-pay | Admitting: Internal Medicine

## 2017-03-30 ENCOUNTER — Other Ambulatory Visit: Payer: Self-pay | Admitting: Internal Medicine

## 2017-03-30 ENCOUNTER — Ambulatory Visit (INDEPENDENT_AMBULATORY_CARE_PROVIDER_SITE_OTHER): Payer: BLUE CROSS/BLUE SHIELD

## 2017-03-30 DIAGNOSIS — E538 Deficiency of other specified B group vitamins: Secondary | ICD-10-CM

## 2017-03-30 MED ORDER — CYANOCOBALAMIN 1000 MCG/ML IJ SOLN
1000.0000 ug | Freq: Once | INTRAMUSCULAR | Status: AC
  Administered 2017-03-30: 1000 ug via INTRAMUSCULAR

## 2017-03-30 NOTE — Progress Notes (Signed)
b12 Injection given.   Kinslee Dalpe J Halea Lieb, MD  

## 2017-04-02 ENCOUNTER — Other Ambulatory Visit: Payer: Self-pay | Admitting: Internal Medicine

## 2017-04-02 ENCOUNTER — Encounter: Payer: Self-pay | Admitting: Internal Medicine

## 2017-04-02 DIAGNOSIS — E519 Thiamine deficiency, unspecified: Secondary | ICD-10-CM | POA: Insufficient documentation

## 2017-04-02 MED ORDER — VITAMIN B-1 100 MG PO TABS: 100.0000 mg | ORAL_TABLET | Freq: Every day | ORAL | 1 refills | Status: DC

## 2017-04-04 ENCOUNTER — Encounter: Payer: Self-pay | Admitting: Internal Medicine

## 2017-04-04 ENCOUNTER — Other Ambulatory Visit: Payer: Self-pay | Admitting: Internal Medicine

## 2017-04-04 DIAGNOSIS — Z9884 Bariatric surgery status: Secondary | ICD-10-CM

## 2017-04-04 DIAGNOSIS — E60 Dietary zinc deficiency: Secondary | ICD-10-CM

## 2017-04-04 LAB — THYROID PANEL WITH TSH
Free Thyroxine Index: 2.2 (ref 1.4–3.8)
T3 Uptake: 27 % (ref 22–35)
T4 TOTAL: 8.1 ug/dL (ref 5.1–11.9)
TSH: 1.19 mIU/L

## 2017-04-04 LAB — ZINC: ZINC: 54 ug/dL — AB (ref 60–130)

## 2017-04-04 LAB — TIQ-NTM

## 2017-04-04 LAB — VITAMIN B1: Vitamin B1 (Thiamine): <6 nmol/L — ABNORMAL LOW (ref 8–30)

## 2017-04-04 MED ORDER — ZINC GLUCONATE 50 MG PO TABS: 50.0000 mg | ORAL_TABLET | Freq: Every day | ORAL | 1 refills | Status: DC

## 2017-04-06 ENCOUNTER — Encounter: Payer: Self-pay | Admitting: Internal Medicine

## 2017-04-06 DIAGNOSIS — E611 Iron deficiency: Secondary | ICD-10-CM

## 2017-04-06 DIAGNOSIS — Z9884 Bariatric surgery status: Secondary | ICD-10-CM

## 2017-04-09 MED ORDER — FERIVA 21/7 75-1 MG PO TABS: 1.0000 | ORAL_TABLET | Freq: Every day | ORAL | 11 refills | Status: DC

## 2017-04-19 ENCOUNTER — Other Ambulatory Visit: Payer: Self-pay | Admitting: Internal Medicine

## 2017-04-19 ENCOUNTER — Encounter: Payer: Self-pay | Admitting: Internal Medicine

## 2017-04-19 DIAGNOSIS — D508 Other iron deficiency anemias: Secondary | ICD-10-CM

## 2017-04-19 MED ORDER — FERROUS SULFATE 325 (65 FE) MG PO TABS: 325.0000 mg | ORAL_TABLET | Freq: Two times a day (BID) | ORAL | 1 refills | Status: DC

## 2017-05-03 ENCOUNTER — Ambulatory Visit (INDEPENDENT_AMBULATORY_CARE_PROVIDER_SITE_OTHER): Payer: BLUE CROSS/BLUE SHIELD | Admitting: Emergency Medicine

## 2017-05-03 DIAGNOSIS — D508 Other iron deficiency anemias: Secondary | ICD-10-CM

## 2017-05-03 MED ORDER — CYANOCOBALAMIN 1000 MCG/ML IJ SOLN
1000.0000 ug | Freq: Once | INTRAMUSCULAR | Status: AC
  Administered 2017-05-03: 1000 ug via INTRAMUSCULAR

## 2017-05-03 NOTE — Progress Notes (Signed)
I have reviewed and agree.

## 2017-05-12 ENCOUNTER — Other Ambulatory Visit: Payer: Self-pay | Admitting: Internal Medicine

## 2017-05-12 DIAGNOSIS — K219 Gastro-esophageal reflux disease without esophagitis: Secondary | ICD-10-CM

## 2017-06-28 ENCOUNTER — Ambulatory Visit (INDEPENDENT_AMBULATORY_CARE_PROVIDER_SITE_OTHER): Payer: BLUE CROSS/BLUE SHIELD | Admitting: *Deleted

## 2017-06-28 DIAGNOSIS — E538 Deficiency of other specified B group vitamins: Secondary | ICD-10-CM

## 2017-06-28 MED ORDER — CYANOCOBALAMIN 1000 MCG/ML IJ SOLN
1000.0000 ug | Freq: Once | INTRAMUSCULAR | Status: AC
  Administered 2017-06-28: 1000 ug via INTRAMUSCULAR

## 2017-06-28 NOTE — Progress Notes (Signed)
I have reviewed and agree.

## 2017-08-08 ENCOUNTER — Ambulatory Visit: Payer: BLUE CROSS/BLUE SHIELD | Admitting: Family

## 2017-08-08 ENCOUNTER — Other Ambulatory Visit (INDEPENDENT_AMBULATORY_CARE_PROVIDER_SITE_OTHER): Payer: BLUE CROSS/BLUE SHIELD

## 2017-08-08 ENCOUNTER — Other Ambulatory Visit: Payer: Self-pay | Admitting: Internal Medicine

## 2017-08-08 ENCOUNTER — Encounter: Payer: Self-pay | Admitting: Family

## 2017-08-08 VITALS — BP 126/88 | HR 93 | Temp 97.7°F | Ht 66.0 in | Wt 278.0 lb

## 2017-08-08 DIAGNOSIS — R899 Unspecified abnormal finding in specimens from other organs, systems and tissues: Secondary | ICD-10-CM

## 2017-08-08 DIAGNOSIS — K219 Gastro-esophageal reflux disease without esophagitis: Secondary | ICD-10-CM | POA: Diagnosis not present

## 2017-08-08 DIAGNOSIS — F419 Anxiety disorder, unspecified: Secondary | ICD-10-CM | POA: Diagnosis not present

## 2017-08-08 DIAGNOSIS — R5383 Other fatigue: Secondary | ICD-10-CM

## 2017-08-08 DIAGNOSIS — E519 Thiamine deficiency, unspecified: Secondary | ICD-10-CM

## 2017-08-08 LAB — CBC WITH DIFFERENTIAL/PLATELET
BASOS ABS: 0.1 10*3/uL (ref 0.0–0.1)
BASOS PCT: 1.4 % (ref 0.0–3.0)
EOS ABS: 0.2 10*3/uL (ref 0.0–0.7)
Eosinophils Relative: 2.6 % (ref 0.0–5.0)
HEMATOCRIT: 45.6 % (ref 36.0–46.0)
Hemoglobin: 14.5 g/dL (ref 12.0–15.0)
LYMPHS ABS: 2.4 10*3/uL (ref 0.7–4.0)
LYMPHS PCT: 29.6 % (ref 12.0–46.0)
MCHC: 31.8 g/dL (ref 30.0–36.0)
MCV: 81.8 fl (ref 78.0–100.0)
Monocytes Absolute: 0.6 10*3/uL (ref 0.1–1.0)
Monocytes Relative: 7.9 % (ref 3.0–12.0)
NEUTROS ABS: 4.8 10*3/uL (ref 1.4–7.7)
NEUTROS PCT: 58.5 % (ref 43.0–77.0)
PLATELETS: 321 10*3/uL (ref 150.0–400.0)
RBC: 5.58 Mil/uL — ABNORMAL HIGH (ref 3.87–5.11)
RDW: 14.8 % (ref 11.5–15.5)
WBC: 8.1 10*3/uL (ref 4.0–10.5)

## 2017-08-08 LAB — COMPREHENSIVE METABOLIC PANEL
ALBUMIN: 3.8 g/dL (ref 3.5–5.2)
ALT: 12 U/L (ref 0–35)
AST: 12 U/L (ref 0–37)
Alkaline Phosphatase: 72 U/L (ref 39–117)
BUN: 11 mg/dL (ref 6–23)
CALCIUM: 9 mg/dL (ref 8.4–10.5)
CO2: 29 mEq/L (ref 19–32)
CREATININE: 0.66 mg/dL (ref 0.40–1.20)
Chloride: 101 mEq/L (ref 96–112)
GFR: 100.84 mL/min (ref >60.00)
Glucose, Bld: 86 mg/dL (ref 70–99)
Potassium: 4.2 mEq/L (ref 3.5–5.1)
Sodium: 136 mEq/L (ref 135–145)
TOTAL PROTEIN: 8 g/dL (ref 6.0–8.3)
Total Bilirubin: 0.2 mg/dL (ref 0.2–1.2)

## 2017-08-08 LAB — VITAMIN B12: Vitamin B-12: 560 pg/mL (ref 211–911)

## 2017-08-08 LAB — MAGNESIUM: Magnesium: 2 mg/dL (ref 1.5–2.5)

## 2017-08-08 LAB — TSH: TSH: 1.48 u[IU]/mL (ref 0.35–4.50)

## 2017-08-08 MED ORDER — ESCITALOPRAM OXALATE 10 MG PO TABS: 10.0000 mg | ORAL_TABLET | Freq: Every day | ORAL | 2 refills | Status: DC

## 2017-08-08 MED ORDER — OMEPRAZOLE 20 MG PO CPDR: 20.0000 mg | DELAYED_RELEASE_CAPSULE | Freq: Two times a day (BID) | ORAL | 6 refills | Status: DC

## 2017-08-08 NOTE — Progress Notes (Signed)
Valerie Sweeney is a 52 y.o. female with the following history as recorded in Epic[facility]:  Patient Active Problem List   Diagnosis Date Noted  . Thiamine deficiency 04/02/2017  . Allergic urticaria 12/16/2014  . Dyshidrotic eczema 11/03/2014  . Zinc deficiency 07/07/2014  . Vitamin D deficiency 07/03/2014  . Iron deficiency anemia 07/03/2014  . Status post bariatric surgery 07/02/2014  . Unspecified contraceptive management 04/25/2012  . Other abnormal glucose 04/03/2011  . DJD (degenerative joint disease) of knee 11/23/2010  . Essential hypertension, benign 09/22/2010  . Other screening mammogram 09/22/2010  . Routine general medical examination at a health care facility 09/22/2010  . GERD (gastroesophageal reflux disease) 09/22/2010    Current Outpatient Medications  Medication Sig Dispense Refill  . cetirizine (ZYRTEC) 10 MG tablet Take 1 tablet (10 mg total) by mouth daily. 90 tablet 3  . Cholecalciferol 2000 units TABS Take 1 tablet (2,000 Units total) by mouth daily. 90 tablet 3  . doxepin (SINEQUAN) 10 MG capsule Take 1 capsule (10 mg total) by mouth at bedtime. 90 capsule 1  . ferrous sulfate 325 (65 FE) MG tablet Take 1 tablet (325 mg total) by mouth 2 (two) times daily with a meal. 180 tablet 1  . levonorgestrel (MIRENA, 52 MG,) 20 MCG/24HR IUD Mirena 20 mcg/24 hr (5 years) intrauterine device    . thiamine ([facility] B-1) 100 MG tablet Take 1 tablet (100 mg total) by mouth daily. 90 tablet 1  . zinc gluconate 50 MG tablet Take 1 tablet (50 mg total) by mouth daily. 90 tablet 1  . escitalopram (LEXAPRO) 10 MG tablet Take 1 tablet (10 mg total) by mouth daily. 30 tablet 2  . omeprazole (PRILOSEC) 20 MG capsule Take 1 capsule (20 mg total) by mouth 2 (two) times daily before a meal. 60 capsule 6   No current facility-administered medications for this visit.     Allergies: Pork-derived products  Past Medical History:  Diagnosis Date  . Allergy    SEASONAL ALLERGIES  . Anemia    TAKES IRON AND STATES ALWAYS BEEN ANEMIC  . Esophageal dysmotilities   . GERD (gastroesophageal reflux disease)   . Hiatal hernia   . Hypertension   . Knee pain    MORE DIFFICULT TO GET UP AND DOWN--RELATES TO HER WEIGHT  . Vitamin D deficiency     Past Surgical History:  Procedure Laterality Date  . BREATH TEK H PYLORI  01/24/2011   Procedure: BREATH TEK H PYLORI;  Surgeon: Shann Medal, MD;  Location: [facility];  Service: General;  Laterality: N/A;  . CHOLECYSTECTOMY    . LAPAROSCOPIC GASTRECTOMY     sleeve    Family History  Problem Relation Age of Onset  . Hypertension Mother   . Stroke Father   . Hypertension Father   . Diabetes Father   . Cancer Neg Hx   . Colon cancer Neg Hx     Social History   Tobacco Use  . Smoking status: Never Smoker  . Smokeless tobacco: Never Used  Substance Use Topics  . 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   Objective:  Vitals:   08/08/17 1550  BP: 126/88  Pulse: 93  Temp: 97.7 F (36.5 C)  TempSrc: Oral  SpO2: 97%  Weight: 278 lb 0.6 oz (126.1 kg)  Height: 5' 6" (1.676 m)    General: Well developed, well nourished, in no acute distress/ tearful in office  Skin : Warm and dry.  Head: Normocephalic and atraumatic  Lungs: Respirations unlabored; clear to auscultation bilaterally without wheeze, rales, rhonchi  CVS exam: normal rate and regular rhythm.  Neurologic: Alert and oriented; speech intact;  face symmetrical; moves all extremities well; CNII-XII intact without focal deficit  Assessment:  1. Other fatigue   2. Abnormal laboratory test   3. Gastroesophageal reflux disease without esophagitis   4. Anxiety     Plan:  Update labs today; refer to therapy; start Lexapro 10 mg daily- risks and benefits discussed; follow-up in 1 month, sooner prn.  Spent 30 minutes with patient; greater than 50% spent in counseling;    Return in about 1 month (around 09/08/2017).  Orders Placed This Encounter  Procedures  . CBC w/Diff    Standing Status:   Future    Number of Occurrences:   1    Standing Expiration Date:   08/08/2018  . Comp Met (CMET)    Standing Status:   Future    Number of Occurrences:   1    Standing Expiration Date:   08/08/2018  . TSH    Standing Status:   Future    Number of Occurrences:   1    Standing Expiration Date:   08/08/2018  . B12    Standing Status:   Future    Number of Occurrences:   1    Standing Expiration Date:   08/08/2018  . Vitamin B1    Standing Status:   Future    Number of Occurrences:   1    Standing Expiration Date:   08/09/2018  . Zinc    Standing Status:   Future    Number of Occurrences:   1    Standing Expiration Date:   08/09/2018  . Magnesium    Standing Status:   Future    Number of Occurrences:   1    Standing Expiration Date:   08/08/2018  . Ambulatory referral to Psychology    Referral Priority:   Urgent    Referral Type:   Psychiatric    Referral Reason:   Specialty Services Required    Requested Specialty:   Psychology    Number of Visits Requested:   1    Requested Prescriptions   Signed Prescriptions Disp Refills  . omeprazole (PRILOSEC) 20 MG capsule 60 capsule 6    Sig: Take 1 capsule (20 mg total) by mouth 2 (two) times daily before a meal.  . escitalopram (LEXAPRO) 10 MG tablet 30 tablet 2    Sig: Take 1 tablet (10 mg total) by mouth daily.

## 2017-08-09 ENCOUNTER — Other Ambulatory Visit: Payer: Self-pay | Admitting: Internal Medicine

## 2017-08-09 DIAGNOSIS — D508 Other iron deficiency anemias: Secondary | ICD-10-CM

## 2017-08-20 LAB — VITAMIN B1: Vitamin B1 (Thiamine): 37 nmol/L — ABNORMAL HIGH (ref 8–30)

## 2017-08-20 LAB — ZINC: Zinc: 58 ug/dL — ABNORMAL LOW (ref 60–130)

## 2017-09-03 ENCOUNTER — Other Ambulatory Visit: Payer: Self-pay

## 2017-09-03 MED ORDER — ESCITALOPRAM OXALATE 10 MG PO TABS: 10.0000 mg | ORAL_TABLET | Freq: Every day | ORAL | 0 refills | Status: DC

## 2017-09-04 ENCOUNTER — Ambulatory Visit: Payer: BLUE CROSS/BLUE SHIELD | Admitting: Family

## 2017-09-04 ENCOUNTER — Encounter: Payer: Self-pay | Admitting: Family

## 2017-09-04 VITALS — BP 130/82 | HR 87 | Temp 98.0°F | Ht 66.0 in | Wt 278.0 lb

## 2017-09-04 DIAGNOSIS — R5383 Other fatigue: Secondary | ICD-10-CM | POA: Diagnosis not present

## 2017-09-04 MED ORDER — ESCITALOPRAM OXALATE 20 MG PO TABS: 20.0000 mg | ORAL_TABLET | Freq: Every day | ORAL | 1 refills | Status: DC

## 2017-09-04 NOTE — Progress Notes (Signed)
Valerie Sweeney is a 52 y.o. female with the following history as recorded in EpicCare:  Patient Active Problem List   Diagnosis Date Noted  . Thiamine deficiency 04/02/2017  . Allergic urticaria 12/16/2014  . Dyshidrotic eczema 11/03/2014  . Zinc deficiency 07/07/2014  . Vitamin D deficiency 07/03/2014  . Iron deficiency anemia 07/03/2014  . Status post bariatric surgery 07/02/2014  . Unspecified contraceptive management 04/25/2012  . Other abnormal glucose 04/03/2011  . DJD (degenerative joint disease) of knee 11/23/2010  . Essential hypertension, benign 09/22/2010  . Other screening mammogram 09/22/2010  . Routine general medical examination at a health care facility 09/22/2010  . GERD (gastroesophageal reflux disease) 09/22/2010    Current Outpatient Medications  Medication Sig Dispense Refill  . cetirizine (ZYRTEC) 10 MG tablet Take 1 tablet (10 mg total) by mouth daily. 90 tablet 3  . Cholecalciferol 2000 units TABS Take 1 tablet (2,000 Units total) by mouth daily. 90 tablet 3  . doxepin (SINEQUAN) 10 MG capsule Take 1 capsule (10 mg total) by mouth at bedtime. 90 capsule 1  . escitalopram (LEXAPRO) 20 MG tablet Take 1 tablet (20 mg total) by mouth daily. 90 tablet 1  . ferrous sulfate 325 (65 FE) MG tablet TAKE 1 TABLET (325 MG TOTAL) BY MOUTH 2 (TWO) TIMES DAILY WITH A MEAL. 180 tablet 1  . levonorgestrel (MIRENA, 52 MG,) 20 MCG/24HR IUD Mirena 20 mcg/24 hr (5 years) intrauterine device    . omeprazole (PRILOSEC) 20 MG capsule Take 1 capsule (20 mg total) by mouth 2 (two) times daily before a meal. 60 capsule 6  . thiamine (CVS B-1) 100 MG tablet Take 1 tablet (100 mg total) by mouth daily. 90 tablet 1  . zinc gluconate 50 MG tablet Take 1 tablet (50 mg total) by mouth daily. 90 tablet 1   No current facility-administered medications for this visit.     Allergies: Pork-derived products  Past Medical History:  Diagnosis Date  . Allergy    SEASONAL ALLERGIES  . Anemia    TAKES IRON AND STATES ALWAYS BEEN ANEMIC  . Esophageal dysmotilities   . GERD (gastroesophageal reflux disease)   . Hiatal hernia   . Hypertension   . Knee pain    MORE DIFFICULT TO GET UP AND DOWN--RELATES TO HER WEIGHT  . Vitamin D deficiency     Past Surgical History:  Procedure Laterality Date  . BREATH TEK H PYLORI  01/24/2011   Procedure: BREATH TEK H PYLORI;  Surgeon: Kandis Cocking, MD;  Location: [facility];  Service: General;  Laterality: N/A;  . CHOLECYSTECTOMY    . LAPAROSCOPIC GASTRECTOMY     sleeve    Family History  Problem Relation Age of Onset  . Hypertension Mother   . Stroke Father   . Hypertension Father   . Diabetes Father   . Cancer Neg Hx   . Colon cancer Neg Hx     Social History   Tobacco Use  . Smoking status: Never Smoker  . Smokeless tobacco: Never Used  Substance Use Topics  . Alcohol use: No    Subjective:  Patient presents to follow-up on recent start of Lexapro 10 mg daily; has noticed improvement in her symptoms since starting the medication- feels that she is less overwhelmed by her life at this time; would be interested in trying increased dosage of medication; would also like information to get set up with a counselor; Did review her results from last set of labs and  had no concerns/ questions;    Objective:  Vitals:   09/04/17 0925  BP: 130/82  Pulse: 87  Temp: 98 F (36.7 C)  TempSrc: Oral  SpO2: 98%  Weight: 278 lb (126.1 kg)  Height: 5\' 6"  (1.676 m)    General: Well developed, well nourished, in no acute distress  Skin : Warm and dry.  Head: Normocephalic and atraumatic  Lungs: Respirations unlabored; clear to auscultation bilaterally without wheeze, rales, rhonchi  Neurologic: Alert and oriented; speech intact; face symmetrical; moves all extremities well; CNII-XII intact without focal deficit   Assessment:  1. Other fatigue     Plan:  Secondary to situational anxiety/ depression; responding to treatment- increase  to Lexapro 20 mg daily; contact information given for [facility]; follow-up in 3 months, sooner prn.    Return in about 3 months (around 12/05/2017).  No orders of the defined types were placed in this encounter.   Requested Prescriptions   Signed Prescriptions Disp Refills  . escitalopram (LEXAPRO) 20 MG tablet 90 tablet 1    Sig: Take 1 tablet (20 mg total) by mouth daily.

## 2017-09-05 ENCOUNTER — Ambulatory Visit (INDEPENDENT_AMBULATORY_CARE_PROVIDER_SITE_OTHER): Payer: BLUE CROSS/BLUE SHIELD

## 2017-09-05 DIAGNOSIS — E538 Deficiency of other specified B group vitamins: Secondary | ICD-10-CM | POA: Diagnosis not present

## 2017-09-05 DIAGNOSIS — Z23 Encounter for immunization: Secondary | ICD-10-CM | POA: Diagnosis not present

## 2017-09-05 MED ORDER — CYANOCOBALAMIN 1000 MCG/ML IJ SOLN
1000.0000 ug | Freq: Once | INTRAMUSCULAR | Status: AC
  Administered 2017-09-05: 1000 ug via INTRAMUSCULAR

## 2017-09-05 NOTE — Progress Notes (Addendum)
Flu and B12 given in office today.  Agree with plan;

## 2017-09-11 ENCOUNTER — Ambulatory Visit: Payer: Self-pay

## 2017-09-21 ENCOUNTER — Other Ambulatory Visit: Payer: Self-pay | Admitting: Internal Medicine

## 2017-09-21 ENCOUNTER — Encounter: Payer: Self-pay | Admitting: Family

## 2017-09-21 ENCOUNTER — Ambulatory Visit: Payer: BLUE CROSS/BLUE SHIELD | Admitting: Family

## 2017-09-21 VITALS — BP 124/82 | HR 73 | Temp 98.3°F | Ht 66.0 in | Wt 278.0 lb

## 2017-09-21 DIAGNOSIS — R55 Syncope and collapse: Secondary | ICD-10-CM | POA: Diagnosis not present

## 2017-09-21 NOTE — Progress Notes (Signed)
Valerie Sweeney is a 52 y.o. female with the following history as recorded in EpicCare:  Patient Active Problem List   Diagnosis Date Noted  . Thiamine deficiency 04/02/2017  . Allergic urticaria 12/16/2014  . Dyshidrotic eczema 11/03/2014  . Zinc deficiency 07/07/2014  . Vitamin D deficiency 07/03/2014  . Iron deficiency anemia 07/03/2014  . Status post bariatric surgery 07/02/2014  . Unspecified contraceptive management 04/25/2012  . Other abnormal glucose 04/03/2011  . DJD (degenerative joint disease) of knee 11/23/2010  . Essential hypertension, benign 09/22/2010  . Other screening mammogram 09/22/2010  . Routine general medical examination at a health care facility 09/22/2010  . GERD (gastroesophageal reflux disease) 09/22/2010    Current Outpatient Medications  Medication Sig Dispense Refill  . cetirizine (ZYRTEC) 10 MG tablet Take 1 tablet (10 mg total) by mouth daily. 90 tablet 3  . Cholecalciferol 2000 units TABS Take 1 tablet (2,000 Units total) by mouth daily. 90 tablet 3  . doxepin (SINEQUAN) 10 MG capsule Take 1 capsule (10 mg total) by mouth at bedtime. 90 capsule 1  . escitalopram (LEXAPRO) 20 MG tablet Take 1 tablet (20 mg total) by mouth daily. 90 tablet 1  . ferrous sulfate 325 (65 FE) MG tablet TAKE 1 TABLET (325 MG TOTAL) BY MOUTH 2 (TWO) TIMES DAILY WITH A MEAL. 180 tablet 1  . levonorgestrel (MIRENA, 52 MG,) 20 MCG/24HR IUD Mirena 20 mcg/24 hr (5 years) intrauterine device    . omeprazole (PRILOSEC) 20 MG capsule Take 1 capsule (20 mg total) by mouth 2 (two) times daily before a meal. 60 capsule 6  . thiamine (CVS B-1) 100 MG tablet Take 1 tablet (100 mg total) by mouth daily. 90 tablet 1  . zinc gluconate 50 MG tablet Take 1 tablet (50 mg total) by mouth daily. 90 tablet 1   No current facility-administered medications for this visit.     Allergies: Pork-derived products  Past Medical History:  Diagnosis Date  . Allergy    SEASONAL ALLERGIES  . Anemia    TAKES IRON AND STATES ALWAYS BEEN ANEMIC  . Esophageal dysmotilities   . GERD (gastroesophageal reflux disease)   . Hiatal hernia   . Hypertension   . Knee pain    MORE DIFFICULT TO GET UP AND DOWN--RELATES TO HER WEIGHT  . Vitamin D deficiency     Past Surgical History:  Procedure Laterality Date  . BREATH TEK H PYLORI  01/24/2011   Procedure: BREATH TEK H PYLORI;  Surgeon: Kandis Cockingavid H Newman, MD;  Location: [facility];  Service: General;  Laterality: N/A;  . CHOLECYSTECTOMY    . LAPAROSCOPIC GASTRECTOMY     sleeve    Family History  Problem Relation Age of Onset  . Hypertension Mother   . Stroke Father   . Hypertension Father   . Diabetes Father   . Cancer Neg Hx   . Colon cancer Neg Hx     Social History   Tobacco Use  . Smoking status: Never Smoker  . Smokeless tobacco: Never Used  Substance Use Topics  . Alcohol use: No    Subjective:  [place] in [place] last [place]- visiting daughter at inpatient treatment facility for anxiety/ eating disorder; got there on Tuesday; Wednesday evening was walking down street- strangers noticed "her going down." Did not hit her head on sidewalk; was apparently out for 15 minutes/ "drooling at mouth"- no prior history of seizure/ no family history of seizure; did lose control of bladder; Went to  the ER after event- had normal head CT, normal labs, normal EKG; was recommended to see neurology in 1-2 days; she opted against being seen in [place] and would like to see neurology here. Started drinking large amount of Gatorade after leaving ER and started feeling better.  She admits that between the stress level/ reason for the visit and the heat she feels she most likely just collapsed due to fatigue/ exhaustion. She is questioning actual diagnosis of seizure.    Objective:  Vitals:   09/21/17 1507  BP: 124/82  Pulse: 73  Temp: 98.3 F (36.8 C)  TempSrc: Oral  SpO2: 98%  Weight: 278 lb (126.1 kg)  Height: 5\' 6"  (1.676 m)    General:  Well developed, well nourished, in no acute distress  Skin : Warm and dry.  Head: Normocephalic and atraumatic  Eyes: Sclera and conjunctiva clear; pupils round and reactive to light; extraocular movements intact  Ears: External normal; canals clear; tympanic membranes normal  Oropharynx: Pink, supple. No suspicious lesions  Neck: Supple without thyromegaly, adenopathy  Lungs: Respirations unlabored; clear to auscultation bilaterally without wheeze, rales, rhonchi  CVS exam: normal rate and regular rhythm.  Neurologic: Alert and oriented; speech intact; face symmetrical; moves all extremities well; CNII-XII intact without focal deficit  Assessment:  1. Syncope, unspecified syncope type     Plan:  ? Actual seizure vs syncopal episode; STAT referral to neurology; discussed driving limitations/ restrictions if she is found to have seizure; encouraged rest, fluids; follow-up to be determined.     No follow-ups on file.  Orders Placed This Encounter  Procedures  . Ambulatory referral to Neurology    Referral Priority:   Urgent    Referral Type:   Consultation    Referral Reason:   Specialty Services Required    Requested Specialty:   Neurology    Number of Visits Requested:   1    Requested Prescriptions    No prescriptions requested or ordered in this encounter

## 2017-09-25 ENCOUNTER — Ambulatory Visit (INDEPENDENT_AMBULATORY_CARE_PROVIDER_SITE_OTHER): Payer: BLUE CROSS/BLUE SHIELD | Admitting: Psychology

## 2017-09-25 ENCOUNTER — Encounter: Payer: Self-pay | Admitting: Neurology

## 2017-09-25 ENCOUNTER — Ambulatory Visit: Payer: BLUE CROSS/BLUE SHIELD | Admitting: Psychology

## 2017-09-25 ENCOUNTER — Ambulatory Visit: Payer: BLUE CROSS/BLUE SHIELD | Admitting: Neurology

## 2017-09-25 ENCOUNTER — Other Ambulatory Visit: Payer: Self-pay

## 2017-09-25 VITALS — BP 142/86 | HR 86 | Ht 66.0 in | Wt 278.0 lb

## 2017-09-25 DIAGNOSIS — R55 Syncope and collapse: Secondary | ICD-10-CM | POA: Diagnosis not present

## 2017-09-25 DIAGNOSIS — F331 Major depressive disorder, recurrent, moderate: Secondary | ICD-10-CM | POA: Diagnosis not present

## 2017-09-25 NOTE — Patient Instructions (Signed)
1. Schedule routine EEG 2. Our office will call you with results, if normal, follow-up on as needed basis. Call for any changes

## 2017-09-25 NOTE — Progress Notes (Signed)
NEUROLOGY CONSULTATION NOTE  Valerie Sweeney MRN: [id number] DOB: 10-14-1965  Referring provider: Ria Clock, FNP Primary care provider: Ria Clock, FNP  Reason for consult:  syncope  Thank you for your kind referral of Valerie Sweeney for consultation of the above symptoms. Although her history is well known to you, please allow me to reiterate it for the purpose of our medical record. She is alone in the office today. Records and images were personally reviewed where available.  HISTORY OF PRESENT ILLNESS: This is a very pleasant 52 year old right-handed woman with a history of hypertension, gastric sleeve surgery, presenting for evaluation of an episode of loss of consciousness last 09/19/17. She had a stressful night prior flying in to [place] and getting to her hotel late. She visited her daughter the next morning and felt more stressed about her daughter's situation. She does not think she drank more than a bottle of water the whole time. She recalls feeling hot and sweaty. That evening, she was at a cafe and had a soda, she was sitting and felt both hands were tremulous, she felt shaky. She stood up and without warning lost consciousness standing at the counter. A bystander caught her before she fell to the ground. She woke up on the floor and was told she was out for 15 minutes and there was drool on the side of her mouth. No tongue bite. No focal weakness, she felt dizzy, sweaty, sat and got air, then drove back to her hotel. She saw that she had soiled herself with stool. She started having a bad headache and called an [facility] to bring her to the local hospital. She recalls having normal bloodwork, head CT and EKG. She was told it may be dehydration and was given IV fluids. She has been "100% normal" since then. She denies any prior episodes of loss of consciousness.   She denies any staring/unresponsive episodes, gaps in time, olfactory/gustatory hallucinations, deja vu, rising epigastric  sensation, focal numbness/tingling/weakness, myoclonic jerks. She has had some tension-type headaches with frontal pressure, with no need for medication. She has occasional dizziness upon standing but did not feel dizzy prior to the syncopal event. She denies any diplopia, dysarthria/dysphagia, neck/back pain, bowel/bladder dysfunction. She has always had a weak memory. She had a normal birth and early development.  There is no history of febrile convulsions, CNS infections such as meningitis/encephalitis, significant traumatic brain injury, neurosurgical procedures, or family history of seizures.  PAST MEDICAL HISTORY: Past Medical History:  Diagnosis Date  . Allergy    SEASONAL ALLERGIES  . Anemia    TAKES IRON AND STATES ALWAYS BEEN ANEMIC  . Esophageal dysmotilities   . GERD (gastroesophageal reflux disease)   . Hiatal hernia   . Hypertension   . Knee pain    MORE DIFFICULT TO GET UP AND DOWN--RELATES TO HER WEIGHT  . Vitamin D deficiency     PAST SURGICAL HISTORY: Past Surgical History:  Procedure Laterality Date  . BREATH TEK H PYLORI  01/24/2011   Procedure: BREATH TEK H PYLORI;  Surgeon: Kandis Cocking, MD;  Location: [facility];  Service: General;  Laterality: N/A;  . CHOLECYSTECTOMY    . LAPAROSCOPIC GASTRECTOMY     sleeve    MEDICATIONS: Current Outpatient Medications on File Prior to Visit  Medication Sig Dispense Refill  . cetirizine (ZYRTEC) 10 MG tablet Take 1 tablet (10 mg total) by mouth daily. 90 tablet 3  . Cholecalciferol 2000 units TABS Take 1  tablet (2,000 Units total) by mouth daily. 90 tablet 3  . doxepin (SINEQUAN) 10 MG capsule Take 1 capsule (10 mg total) by mouth at bedtime. 90 capsule 1  . escitalopram (LEXAPRO) 20 MG tablet Take 1 tablet (20 mg total) by mouth daily. 90 tablet 1  . ferrous sulfate 325 (65 FE) MG tablet TAKE 1 TABLET (325 MG TOTAL) BY MOUTH 2 (TWO) TIMES DAILY WITH A MEAL. 180 tablet 1  . levonorgestrel (MIRENA, 52 MG,) 20 MCG/24HR  IUD Mirena 20 mcg/24 hr (5 years) intrauterine device    . omeprazole (PRILOSEC) 20 MG capsule Take 1 capsule (20 mg total) by mouth 2 (two) times daily before a meal. 60 capsule 6  . thiamine (CVS B-1) 100 MG tablet Take 1 tablet (100 mg total) by mouth daily. 90 tablet 1  . zinc gluconate 50 MG tablet Take 1 tablet (50 mg total) by mouth daily. 90 tablet 1  . [DISCONTINUED] calcium citrate-vitamin D 200-200 MG-UNIT TABS Take 1 tablet by mouth daily.     . [DISCONTINUED] Iron 66 MG TABS Take 1 tablet (66 mg total) by mouth daily. 30 tablet 11  . [DISCONTINUED] lisinopril (PRINIVIL,ZESTRIL) 20 MG tablet Take 20 mg by mouth at bedtime.     No current facility-administered medications on file prior to visit.     ALLERGIES: Allergies  Allergen Reactions  . Pork-Derived Products Other (See Comments)    Belief     FAMILY HISTORY: Family History  Problem Relation Age of Onset  . Hypertension Mother   . Stroke Father   . Hypertension Father   . Diabetes Father   . Cancer Neg Hx   . Colon cancer Neg Hx     SOCIAL HISTORY: Social History   Socioeconomic History  . Marital status: Married    Spouse name: Not on file  . Number of children: Not on file  . Years of education: Not on file  . Highest education level: Not on file  Occupational History  . Not on file  Social Needs  . Financial resource strain: Not on file  . Food insecurity:    Worry: Not on file    Inability: Not on file  . Transportation needs:    Medical: Not on file    Non-medical: Not on file  Tobacco Use  . Smoking status: Never Smoker  . Smokeless tobacco: Never Used  Substance and Sexual Activity  . Alcohol use: No  . Drug use: No  . Sexual activity: Yes    Birth control/protection: IUD  Lifestyle  . Physical activity:    Days per week: Not on file    Minutes per session: Not on file  . Stress: Not on file  Relationships  . Social connections:    Talks on phone: Not on file    Gets together:  Not on file    Attends religious service: Not on file    Active member of club or organization: Not on file    Attends meetings of clubs or organizations: Not on file    Relationship status: Not on file  . Intimate partner violence:    Fear of current or ex partner: Not on file    Emotionally abused: Not on file    Physically abused: Not on file    Forced sexual activity: Not on file  Other Topics Concern  . Not on file  Social History Narrative   Caffienated drinks-Yes once daily   Seat belt use often-yes  Regular Exercise-no   Smoke alarm in the home-yes   Firearms/guns in the home-no   History of physical abuse-no                REVIEW OF SYSTEMS: Constitutional: No fevers, chills, or sweats, no generalized fatigue, change in appetite Eyes: No visual changes, double vision, eye pain Ear, nose and throat: No hearing loss, ear pain, nasal congestion, sore throat Cardiovascular: No chest pain, palpitations Respiratory:  No shortness of breath at rest or with exertion, wheezes GastrointestinaI: No nausea, vomiting, diarrhea, abdominal pain, fecal incontinence Genitourinary:  No dysuria, urinary retention or frequency Musculoskeletal:  No neck pain, back pain Integumentary: No rash, pruritus, skin lesions Neurological: as above Psychiatric: No depression, insomnia, anxiety Endocrine: No palpitations, fatigue, diaphoresis, mood swings, change in appetite, change in weight, increased thirst Hematologic/Lymphatic:  No anemia, purpura, petechiae. Allergic/Immunologic: no itchy/runny eyes, nasal congestion, recent allergic reactions, rashes  PHYSICAL EXAM: Vitals:   09/25/17 1038  BP: (!) 142/86  Pulse: 86  SpO2: 98%   General: No acute distress Head:  Normocephalic/atraumatic Eyes: Fundoscopic exam shows bilateral sharp discs, no vessel changes, exudates, or hemorrhages Neck: supple, no paraspinal tenderness, full range of motion Back: No paraspinal tenderness Heart:  regular rate and rhythm Lungs: Clear to auscultation bilaterally. Vascular: No carotid bruits. Skin/Extremities: No rash, no edema Neurological Exam: Mental status: alert and oriented to person, place, and time, no dysarthria or aphasia, Fund of knowledge is appropriate.  Recent and remote memory are intact. 3/3 delayed recall. Attention and concentration are normal.    Able to name objects and repeat phrases. Cranial nerves: CN I: not tested CN II: pupils equal, round and reactive to light, visual fields intact, fundi unremarkable. CN III, IV, VI:  full range of motion, no nystagmus, no ptosis CN V: facial sensation intact CN VII: upper and lower face symmetric CN VIII: hearing intact to finger rub CN IX, X: gag intact, uvula midline CN XI: sternocleidomastoid and trapezius muscles intact CN XII: tongue midline Bulk & Tone: normal, no fasciculations. Motor: 5/5 throughout with no pronator drift. Sensation: intact to light touch, cold, pin, vibration and joint position sense.  No extinction to double simultaneous stimulation.  Romberg test negative Deep Tendon Reflexes: +2 throughout, no ankle clonus Plantar responses: downgoing bilaterally Cerebellar: no incoordination on finger to nose, heel to shin. No dysdiadochokinesia Gait: narrow-based and steady, able to tandem walk adequately. Tremor: none  IMPRESSION: This is a very pleasant 52 year old right-handed woman with a history of hypertension, gastric sleeve surgery, presenting for evaluation of an episode of loss of consciousness suggestive of vasovagal syncope last 09/19/17. She had been tired, sleep-deprived, and likely dehydrated since the night prior. Her neurological exam is normal, no clear epilepsy risk factors. A routine EEG will be done for completion, but the episode is less likely seizure-related. We discussed that if symptoms change, to call our office. Our office will call patient with EEG results, if normal, follow-up on a  prn basis.   Thank you for allowing me to participate in the care of this patient. Please do not hesitate to call for any questions or concerns.   Patrcia DollyKaren Deneane Stifter, M.D.  CC: Ria ClockLaura Murray, FNP

## 2017-09-26 ENCOUNTER — Ambulatory Visit (INDEPENDENT_AMBULATORY_CARE_PROVIDER_SITE_OTHER): Payer: BLUE CROSS/BLUE SHIELD | Admitting: Neurology

## 2017-09-26 DIAGNOSIS — R55 Syncope and collapse: Secondary | ICD-10-CM | POA: Diagnosis not present

## 2017-09-27 ENCOUNTER — Telehealth: Payer: Self-pay | Admitting: Family

## 2017-09-27 NOTE — Telephone Encounter (Signed)
Copied from CRM [phone]. Topic: Quick Communication - See Telephone Encounter >> Sep 27, 2017  1:52 PM Maia Pettiesrtiz, Kristie S wrote: CRM for notification. See Telephone encounter for: 09/27/17. Pt states she will be traveling starting 9/25. Pt wants to come in 9/20 or 9/23 in the afternoon for her B12. Last B12 was 09/05/17. Please advise.

## 2017-09-27 NOTE — Telephone Encounter (Signed)
Is it okay for patient to get it this much sooner, Besides the fact that insurance may not cover it.

## 2017-09-28 ENCOUNTER — Ambulatory Visit (INDEPENDENT_AMBULATORY_CARE_PROVIDER_SITE_OTHER): Payer: BLUE CROSS/BLUE SHIELD | Admitting: Emergency Medicine

## 2017-09-28 DIAGNOSIS — D508 Other iron deficiency anemias: Secondary | ICD-10-CM | POA: Diagnosis not present

## 2017-09-28 MED ORDER — CYANOCOBALAMIN 1000 MCG/ML IJ SOLN
1000.0000 ug | Freq: Once | INTRAMUSCULAR | Status: AC
  Administered 2017-09-28: 1000 ug via INTRAMUSCULAR

## 2017-10-02 NOTE — Procedures (Signed)
ELECTROENCEPHALOGRAM REPORT  Date of Study: 09/26/2017  Patient's Name: Valerie Sweeney MRN: [id number] Date of Birth: 1965-08-19  Referring Provider: Dr. Patrcia DollyKaren Aquino  Clinical History: This is a 52 year old woman with episode of loss of consciousness  Medications: ZYRTEC 10 MG tablet  Cholecalciferol 2000 units TABS  SINEQUAN 10 MG capsule  LEXAPRO 20 MG tablet  ferrous sulfate 325 (65 FE) MG tablet  MIRENA, 52 MG) 20 MCG/24HR PRILOSEC 20 MG capsule  CVS B-1 100 MG tablet  zinc gluconate 50 MG tablet  Technical Summary: A multichannel digital EEG recording measured by the international 10-20 system with electrodes applied with paste and impedances below 5000 ohms performed in our laboratory with EKG monitoring in an awake and asleep patient.  Hyperventilation and photic stimulation were performed.  The digital EEG was referentially recorded, reformatted, and digitally filtered in a variety of bipolar and referential montages for optimal display.    Description: The patient is awake and asleep during the recording.  During maximal wakefulness, there is a symmetric, medium voltage 8 Hz posterior dominant rhythm that attenuates with eye opening.  The record is symmetric.  During drowsiness and sleep, there is an increase in theta slowing of the background.  Vertex waves and symmetric sleep spindles were seen.  Hyperventilation and photic stimulation did not elicit any abnormalities.  There were no epileptiform discharges or electrographic seizures seen.    EKG lead showed sinus bradycardia 60 bpm.  Impression: This awake and asleep EEG is normal.    Clinical Correlation: A normal EEG does not exclude a clinical diagnosis of epilepsy.  If further clinical questions remain, prolonged EEG may be helpful.  Clinical correlation is advised.   Patrcia DollyKaren Aquino, M.D.

## 2017-10-15 ENCOUNTER — Encounter (HOSPITAL_COMMUNITY): Payer: Self-pay

## 2017-10-23 ENCOUNTER — Ambulatory Visit (INDEPENDENT_AMBULATORY_CARE_PROVIDER_SITE_OTHER): Payer: BLUE CROSS/BLUE SHIELD | Admitting: Psychology

## 2017-10-23 DIAGNOSIS — F331 Major depressive disorder, recurrent, moderate: Secondary | ICD-10-CM

## 2017-10-25 ENCOUNTER — Ambulatory Visit: Payer: BLUE CROSS/BLUE SHIELD | Admitting: Family

## 2017-10-25 ENCOUNTER — Encounter: Payer: Self-pay | Admitting: Family

## 2017-10-25 VITALS — BP 150/82 | HR 63 | Temp 98.2°F | Ht 66.0 in

## 2017-10-25 DIAGNOSIS — J209 Acute bronchitis, unspecified: Secondary | ICD-10-CM | POA: Diagnosis not present

## 2017-10-25 MED ORDER — HYDROCOD POLST-CPM POLST ER 10-8 MG/5ML PO SUER: 5.0000 mL | Freq: Every evening | ORAL | 0 refills | Status: DC | PRN

## 2017-10-25 MED ORDER — DOXYCYCLINE HYCLATE 100 MG PO TABS: 100.0000 mg | ORAL_TABLET | Freq: Two times a day (BID) | ORAL | 0 refills | Status: DC

## 2017-10-25 MED ORDER — ALBUTEROL SULFATE (2.5 MG/3ML) 0.083% IN NEBU
2.5000 mg | INHALATION_SOLUTION | Freq: Once | RESPIRATORY_TRACT | Status: AC
  Administered 2017-10-25: 2.5 mg via RESPIRATORY_TRACT

## 2017-10-25 NOTE — Progress Notes (Signed)
Valerie Sweeney is a 52 y.o. female with the following history as recorded in EpicCare:  Patient Active Problem List   Diagnosis Date Noted  . Thiamine deficiency 04/02/2017  . Allergic urticaria 12/16/2014  . Dyshidrotic eczema 11/03/2014  . Zinc deficiency 07/07/2014  . Vitamin D deficiency 07/03/2014  . Iron deficiency anemia 07/03/2014  . Status post bariatric surgery 07/02/2014  . Unspecified contraceptive management 04/25/2012  . Other abnormal glucose 04/03/2011  . DJD (degenerative joint disease) of knee 11/23/2010  . Essential hypertension, benign 09/22/2010  . Other screening mammogram 09/22/2010  . Routine general medical examination at a health care facility 09/22/2010  . GERD (gastroesophageal reflux disease) 09/22/2010    Current Outpatient Medications  Medication Sig Dispense Refill  . cetirizine (ZYRTEC) 10 MG tablet Take 1 tablet (10 mg total) by mouth daily. 90 tablet 3  . Cholecalciferol 2000 units TABS Take 1 tablet (2,000 Units total) by mouth daily. 90 tablet 3  . doxepin (SINEQUAN) 10 MG capsule Take 1 capsule (10 mg total) by mouth at bedtime. 90 capsule 1  . escitalopram (LEXAPRO) 20 MG tablet Take 1 tablet (20 mg total) by mouth daily. 90 tablet 1  . ferrous sulfate 325 (65 FE) MG tablet TAKE 1 TABLET (325 MG TOTAL) BY MOUTH 2 (TWO) TIMES DAILY WITH A MEAL. 180 tablet 1  . levonorgestrel (MIRENA, 52 MG,) 20 MCG/24HR IUD Mirena 20 mcg/24 hr (5 years) intrauterine device    . thiamine (CVS B-1) 100 MG tablet Take 1 tablet (100 mg total) by mouth daily. 90 tablet 1  . zinc gluconate 50 MG tablet Take 1 tablet (50 mg total) by mouth daily. 90 tablet 1  . chlorpheniramine-HYDROcodone (TUSSIONEX PENNKINETIC ER) 10-8 MG/5ML SUER Take 5 mLs by mouth at bedtime as needed for cough. 120 mL 0  . doxycycline (VIBRA-TABS) 100 MG tablet Take 1 tablet (100 mg total) by mouth 2 (two) times daily. 20 tablet 0  . omeprazole (PRILOSEC) 20 MG capsule Take 1 capsule (20 mg total)  by mouth 2 (two) times daily before a meal. (Patient not taking: Reported on 10/25/2017) 60 capsule 6   No current facility-administered medications for this visit.     Allergies: Pork-derived products  Past Medical History:  Diagnosis Date  . Allergy    SEASONAL ALLERGIES  . Anemia    TAKES IRON AND STATES ALWAYS BEEN ANEMIC  . Esophageal dysmotilities   . GERD (gastroesophageal reflux disease)   . Hiatal hernia   . Hypertension   . Knee pain    MORE DIFFICULT TO GET UP AND DOWN--RELATES TO HER WEIGHT  . Vitamin D deficiency     Past Surgical History:  Procedure Laterality Date  . BREATH TEK H PYLORI  01/24/2011   Procedure: BREATH TEK H PYLORI;  Surgeon: Kandis Cocking, MD;  Location: [facility];  Service: General;  Laterality: N/A;  . CHOLECYSTECTOMY    . LAPAROSCOPIC GASTRECTOMY     sleeve    Family History  Problem Relation Age of Onset  . Hypertension Mother   . Stroke Father   . Hypertension Father   . Diabetes Father   . Cancer Neg Hx   . Colon cancer Neg Hx     Social History   Tobacco Use  . Smoking status: Never Smoker  . Smokeless tobacco: Never Used  Substance Use Topics  . Alcohol use: No    Subjective:  Patient presents with cough/ congestion and wheezing x 1 week; recently traveled  and notes that her seat mate was sick; no chest pain; no fevers but does have some night sweats; not prone to bronchitis or pneumonia;   Objective:  Vitals:   10/25/17 1030  BP: (!) 150/82  Pulse: 63  Temp: 98.2 F (36.8 C)  TempSrc: Oral  SpO2: 97%  Height: 5\' 6"  (1.676 m)    General: Well developed, well nourished, in no acute distress  Skin : Warm and dry.  Head: Normocephalic and atraumatic  Eyes: Sclera and conjunctiva clear; pupils round and reactive to light; extraocular movements intact  Ears: External normal; canals clear; tympanic membranes normal  Oropharynx: Pink, supple. No suspicious lesions  Neck: Supple without thyromegaly, adenopathy  Lungs:  Respirations unlabored; wheezing noted on initial exam- improved after nebulizer treatment CVS exam: normal rate and regular rhythm.  Neurologic: Alert and oriented; speech intact; face symmetrical; moves all extremities well; CNII-XII intact without focal deficit   Assessment:  1. Acute bronchitis, unspecified organism     Plan:  Albuterol neb treatment given in office with good relief; Rx for Doxycycline 100 mg bid x 10 days, BREO 200 qd x 10 days; Rx for Tussionex 1 tsp po qhs; increase fluids, rest and follow-up worse, no better.   No follow-ups on file.  No orders of the defined types were placed in this encounter.   Requested Prescriptions   Signed Prescriptions Disp Refills  . doxycycline (VIBRA-TABS) 100 MG tablet 20 tablet 0    Sig: Take 1 tablet (100 mg total) by mouth 2 (two) times daily.  . chlorpheniramine-HYDROcodone (TUSSIONEX PENNKINETIC ER) 10-8 MG/5ML SUER 120 mL 0    Sig: Take 5 mLs by mouth at bedtime as needed for cough.

## 2017-10-27 NOTE — Progress Notes (Deleted)
[facility] [address] [place], [place] [address] Phone: [phone] Subjective:    I'm seeing this patient by the request  of:    CC:   [id number]  Valerie Sweeney is a 52 y.o. female coming in with complaint of ***  Onset-  Location Duration-  Character- Aggravating factors- Reliving factors-  Therapies tried-  Severity-     Past Medical History:  Diagnosis Date  . Allergy    SEASONAL ALLERGIES  . Anemia    TAKES IRON AND STATES ALWAYS BEEN ANEMIC  . Esophageal dysmotilities   . GERD (gastroesophageal reflux disease)   . Hiatal hernia   . Hypertension   . Knee pain    MORE DIFFICULT TO GET UP AND DOWN--RELATES TO HER WEIGHT  . Vitamin D deficiency    Past Surgical History:  Procedure Laterality Date  . BREATH TEK H PYLORI  01/24/2011   Procedure: BREATH TEK H PYLORI;  Surgeon: Kandis Cocking, MD;  Location: [facility];  Service: General;  Laterality: N/A;  . CHOLECYSTECTOMY    . LAPAROSCOPIC GASTRECTOMY     sleeve   Social History   Socioeconomic History  . Marital status: Married    Spouse name: Not on file  . Number of children: Not on file  . Years of education: Not on file  . Highest education level: Not on file  Occupational History  . Not on file  Social Needs  . Financial resource strain: Not on file  . Food insecurity:    Worry: Not on file    Inability: Not on file  . Transportation needs:    Medical: Not on file    Non-medical: Not on file  Tobacco Use  . Smoking status: Never Smoker  . Smokeless tobacco: Never Used  Substance and Sexual Activity  . Alcohol use: No  . Drug use: No  . Sexual activity: Yes    Birth control/protection: IUD  Lifestyle  . Physical activity:    Days per week: Not on file    Minutes per session: Not on file  . Stress: Not on file  Relationships  . Social connections:    Talks on phone: Not on file    Gets together: Not on file    Attends religious service: Not on  file    Active member of club or organization: Not on file    Attends meetings of clubs or organizations: Not on file    Relationship status: Not on file  Other Topics Concern  . Not on file  Social History Narrative   Caffienated drinks-Yes once daily   Seat belt use often-yes   Regular Exercise-no   Smoke alarm in the home-yes   Firearms/guns in the home-no   History of physical abuse-no      Pt lives in single story home with her husband and 3 children (1 currently abroad in [place])   Working on her PhD thesis in Loss adjuster, chartered             Allergies  Allergen Reactions  . Pork-Derived Products Other (See Comments)    Belief    Family History  Problem Relation Age of Onset  . Hypertension Mother   . Stroke Father   . Hypertension Father   . Diabetes Father   . Cancer Neg Hx   . Colon cancer Neg Hx     Current Outpatient Medications (Endocrine & Metabolic):  .  levonorgestrel (MIRENA,  52 MG,) 20 MCG/24HR IUD, Mirena 20 mcg/24 hr (5 years) intrauterine device   Current Outpatient Medications (Respiratory):  .  cetirizine (ZYRTEC) 10 MG tablet, Take 1 tablet (10 mg total) by mouth daily. .  chlorpheniramine-HYDROcodone (TUSSIONEX PENNKINETIC ER) 10-8 MG/5ML SUER, Take 5 mLs by mouth at bedtime as needed for cough.   Current Outpatient Medications (Hematological):  .  ferrous sulfate 325 (65 FE) MG tablet, TAKE 1 TABLET (325 MG TOTAL) BY MOUTH 2 (TWO) TIMES DAILY WITH A MEAL.  Current Outpatient Medications (Other):  Marland Kitchen  Cholecalciferol 2000 units TABS, Take 1 tablet (2,000 Units total) by mouth daily. Marland Kitchen  doxepin (SINEQUAN) 10 MG capsule, Take 1 capsule (10 mg total) by mouth at bedtime. Marland Kitchen  doxycycline (VIBRA-TABS) 100 MG tablet, Take 1 tablet (100 mg total) by mouth 2 (two) times daily. Marland Kitchen  escitalopram (LEXAPRO) 20 MG tablet, Take 1 tablet (20 mg total) by mouth daily. Marland Kitchen  omeprazole (PRILOSEC) 20 MG capsule, Take 1 capsule (20 mg total) by mouth 2  (two) times daily before a meal. (Patient not taking: Reported on 10/25/2017) .  thiamine ([employer] B-1) 100 MG tablet, Take 1 tablet (100 mg total) by mouth daily. Marland Kitchen  zinc gluconate 50 MG tablet, Take 1 tablet (50 mg total) by mouth daily.    Past medical history, social, surgical and family history all reviewed in electronic medical record.  No pertanent information unless stated regarding to the chief complaint.   Review of Systems:  No headache, visual changes, nausea, vomiting, diarrhea, constipation, dizziness, abdominal pain, skin rash, fevers, chills, night sweats, weight loss, swollen lymph nodes, body aches, joint swelling, muscle aches, chest pain, shortness of breath, mood changes.   Objective  There were no vitals taken for this visit. Systems examined below as of    General: No apparent distress alert and oriented x3 mood and affect normal, dressed appropriately.  HEENT: Pupils equal, extraocular movements intact  Respiratory: Patient's speak in full sentences and does not appear short of breath  Cardiovascular: No lower extremity edema, non tender, no erythema  Skin: Warm dry intact with no signs of infection or rash on extremities or on axial skeleton.  Abdomen: Soft nontender  Neuro: Cranial nerves II through XII are intact, neurovascularly intact in all extremities with 2+ DTRs and 2+ pulses.  Lymph: No lymphadenopathy of posterior or anterior cervical chain or axillae bilaterally.  Gait normal with good balance and coordination.  MSK:  Non tender with full range of motion and good stability and symmetric strength and tone of shoulders, elbows, wrist, hip, knee and ankles bilaterally.     Impression and Recommendations:     This case required medical decision making of moderate complexity. The above documentation has been reviewed and is accurate and complete Judi Saa, DO       Note: This dictation was prepared with Dragon dictation along with smaller phrase  technology. Any transcriptional errors that result from this process are unintentional.

## 2017-10-29 ENCOUNTER — Ambulatory Visit: Payer: Self-pay | Admitting: Family Medicine

## 2017-10-29 DIAGNOSIS — Z0289 Encounter for other administrative examinations: Secondary | ICD-10-CM

## 2017-10-30 ENCOUNTER — Other Ambulatory Visit: Payer: Self-pay | Admitting: Internal Medicine

## 2017-10-30 DIAGNOSIS — L301 Dyshidrosis [pompholyx]: Secondary | ICD-10-CM

## 2017-11-06 ENCOUNTER — Ambulatory Visit (INDEPENDENT_AMBULATORY_CARE_PROVIDER_SITE_OTHER): Payer: BLUE CROSS/BLUE SHIELD

## 2017-11-06 DIAGNOSIS — E538 Deficiency of other specified B group vitamins: Secondary | ICD-10-CM

## 2017-11-06 MED ORDER — CYANOCOBALAMIN 1000 MCG/ML IJ SOLN
1000.0000 ug | Freq: Once | INTRAMUSCULAR | Status: AC
  Administered 2017-11-06: 1000 ug via INTRAMUSCULAR

## 2017-11-06 NOTE — Progress Notes (Signed)
B12 given today. 

## 2017-11-07 ENCOUNTER — Ambulatory Visit: Payer: Self-pay

## 2017-11-07 ENCOUNTER — Ambulatory Visit: Payer: BLUE CROSS/BLUE SHIELD | Admitting: Psychology

## 2017-11-21 ENCOUNTER — Ambulatory Visit (INDEPENDENT_AMBULATORY_CARE_PROVIDER_SITE_OTHER): Payer: BLUE CROSS/BLUE SHIELD | Admitting: Psychology

## 2017-11-21 DIAGNOSIS — F331 Major depressive disorder, recurrent, moderate: Secondary | ICD-10-CM | POA: Diagnosis not present

## 2017-11-26 ENCOUNTER — Other Ambulatory Visit: Payer: Self-pay | Admitting: Internal Medicine

## 2017-12-05 ENCOUNTER — Ambulatory Visit (INDEPENDENT_AMBULATORY_CARE_PROVIDER_SITE_OTHER): Payer: BLUE CROSS/BLUE SHIELD | Admitting: Emergency Medicine

## 2017-12-05 ENCOUNTER — Ambulatory Visit (INDEPENDENT_AMBULATORY_CARE_PROVIDER_SITE_OTHER): Payer: BLUE CROSS/BLUE SHIELD | Admitting: Psychology

## 2017-12-05 DIAGNOSIS — F331 Major depressive disorder, recurrent, moderate: Secondary | ICD-10-CM | POA: Diagnosis not present

## 2017-12-05 DIAGNOSIS — E538 Deficiency of other specified B group vitamins: Secondary | ICD-10-CM | POA: Diagnosis not present

## 2017-12-05 MED ORDER — CYANOCOBALAMIN 1000 MCG/ML IJ SOLN
1000.0000 ug | Freq: Once | INTRAMUSCULAR | Status: AC
  Administered 2017-12-05: 1000 ug via INTRAMUSCULAR

## 2018-01-07 ENCOUNTER — Ambulatory Visit (INDEPENDENT_AMBULATORY_CARE_PROVIDER_SITE_OTHER): Payer: BLUE CROSS/BLUE SHIELD | Admitting: Behavioral Health

## 2018-01-07 DIAGNOSIS — E538 Deficiency of other specified B group vitamins: Secondary | ICD-10-CM | POA: Diagnosis not present

## 2018-01-07 MED ORDER — CYANOCOBALAMIN 1000 MCG/ML IJ SOLN
1000.0000 ug | Freq: Once | INTRAMUSCULAR | Status: AC
  Administered 2018-01-07: 1000 ug via INTRAMUSCULAR

## 2018-01-07 NOTE — Progress Notes (Signed)
Patient came in clinic today for monthly B12 injection. IM injection was given in the left deltoid. Patient tolerated it well. No signs or symptoms of a reaction prior to patient leaving the nurse visit.

## 2018-01-21 ENCOUNTER — Ambulatory Visit: Payer: Self-pay

## 2018-01-21 ENCOUNTER — Encounter: Payer: Self-pay | Admitting: Family Medicine

## 2018-01-21 ENCOUNTER — Ambulatory Visit: Payer: BLUE CROSS/BLUE SHIELD | Admitting: Family Medicine

## 2018-01-21 ENCOUNTER — Other Ambulatory Visit: Payer: Self-pay | Admitting: Internal Medicine

## 2018-01-21 VITALS — BP 140/102 | HR 91 | Temp 98.0°F | Ht 66.0 in

## 2018-01-21 DIAGNOSIS — J0100 Acute maxillary sinusitis, unspecified: Secondary | ICD-10-CM | POA: Diagnosis not present

## 2018-01-21 DIAGNOSIS — I10 Essential (primary) hypertension: Secondary | ICD-10-CM | POA: Diagnosis not present

## 2018-01-21 DIAGNOSIS — L301 Dyshidrosis [pompholyx]: Secondary | ICD-10-CM

## 2018-01-21 MED ORDER — AMOXICILLIN-POT CLAVULANATE 875-125 MG PO TABS: 1.0000 | ORAL_TABLET | Freq: Two times a day (BID) | ORAL | 0 refills | Status: DC

## 2018-01-21 NOTE — Assessment & Plan Note (Signed)
-  BP elevated today, may be due to decongestant use.  Recommend avoiding decongestants and f/u with pcp

## 2018-01-21 NOTE — Assessment & Plan Note (Signed)
Maxillary sinusitis with middle ear effusion on R likely due to eustachian tube dysfunction.  Start augmentin Increase fluid intake May continue meclizine prn.  Discussed f/u for any worsening symptoms or if symptoms worsen.

## 2018-01-21 NOTE — Progress Notes (Signed)
Valerie Sweeney - 53 y.o. female MRN [id number]  Date of birth: 1965-11-27  Subjective Chief Complaint  Patient presents with  . Dizziness    for x1 week , earfullness and red eyes    HPI Valerie Sweeney is a 53 y.o. female here today with complaint of dizziness, R sided sinus pain, and R sided ear fullness x1 week.  She had some eye irritation this morning as well but this has resolved.  Dizziness worsened with head movement, and described as sensation that room is spinning.  She has been taking meclizine with some improvement.  She denies fever, chills, chest pain, shortness of breath, palpitations, vomiting or weakness.   ROS:  A comprehensive ROS was completed and negative except as noted per HPI  Allergies  Allergen Reactions  . Pork-Derived Products Other (See Comments)    Belief     Past Medical History:  Diagnosis Date  . Allergy    SEASONAL ALLERGIES  . Anemia    TAKES IRON AND STATES ALWAYS BEEN ANEMIC  . Esophageal dysmotilities   . GERD (gastroesophageal reflux disease)   . Hiatal hernia   . Hypertension   . Knee pain    MORE DIFFICULT TO GET UP AND DOWN--RELATES TO HER WEIGHT  . Vitamin D deficiency     Past Surgical History:  Procedure Laterality Date  . BREATH TEK H PYLORI  01/24/2011   Procedure: BREATH TEK H PYLORI;  Surgeon: Kandis Cocking, MD;  Location: [facility];  Service: General;  Laterality: N/A;  . CHOLECYSTECTOMY    . LAPAROSCOPIC GASTRECTOMY     sleeve    Social History   Socioeconomic History  . Marital status: Married    Spouse name: Not on file  . Number of children: Not on file  . Years of education: Not on file  . Highest education level: Not on file  Occupational History  . Not on file  Social Needs  . Financial resource strain: Not on file  . Food insecurity:    Worry: Not on file    Inability: Not on file  . Transportation needs:    Medical: Not on file    Non-medical: Not on file  Tobacco Use  . Smoking status: Never  Smoker  . Smokeless tobacco: Never Used  Substance and Sexual Activity  . Alcohol use: No  . Drug use: No  . Sexual activity: Yes    Birth control/protection: I.U.D.  Lifestyle  . Physical activity:    Days per week: Not on file    Minutes per session: Not on file  . Stress: Not on file  Relationships  . Social connections:    Talks on phone: Not on file    Gets together: Not on file    Attends religious service: Not on file    Active member of club or organization: Not on file    Attends meetings of clubs or organizations: Not on file    Relationship status: Not on file  Other Topics Concern  . Not on file  Social History Narrative   Caffienated drinks-Yes once daily   Seat belt use often-yes   Regular Exercise-no   Smoke alarm in the home-yes   Firearms/guns in the home-no   History of physical abuse-no      Pt lives in single story home with her husband and 3 children (1 currently abroad in [place])   Working on her PhD thesis in Loss adjuster, chartered  Family History  Problem Relation Age of Onset  . Hypertension Mother   . Stroke Father   . Hypertension Father   . Diabetes Father   . Cancer Neg Hx   . Colon cancer Neg Hx     Health Maintenance  Topic Date Due  . MAMMOGRAM  11/04/2017  . PAP SMEAR-Modifier  11/05/2018  . TETANUS/TDAP  03/16/2021  . COLONOSCOPY  12/21/2024  . INFLUENZA VACCINE  Completed  . HIV Screening  Completed    ----------------------------------------------------------------------------------------------------------------------------------------------------------------------------------------------------------------- Physical Exam BP (!) 140/102   Pulse 91   Temp 98 F (36.7 C) (Oral)   Ht 5\' 6"  (1.676 m)   SpO2 96%   BMI 44.87 kg/m   Physical Exam Constitutional:      Appearance: Normal appearance. She is not ill-appearing or toxic-appearing.  HENT:     Head: Normocephalic and atraumatic.      Right Ear: Ear canal normal. A middle ear effusion is present. Tympanic membrane is not erythematous.     Left Ear: Tympanic membrane normal.     Nose:     Right Turbinates: Enlarged.     Left Turbinates: Enlarged.     Right Sinus: Maxillary sinus tenderness present. No frontal sinus tenderness.     Left Sinus: No maxillary sinus tenderness or frontal sinus tenderness.     Mouth/Throat:     Mouth: Mucous membranes are moist.  Cardiovascular:     Rate and Rhythm: Normal rate and regular rhythm.  Pulmonary:     Breath sounds: Normal breath sounds.  Skin:    General: Skin is warm and dry.  Neurological:     General: No focal deficit present.     Mental Status: She is alert.  Psychiatric:        Mood and Affect: Mood normal.        Behavior: Behavior normal.     ------------------------------------------------------------------------------------------------------------------------------------------------------------------------------------------------------------------- Assessment and Plan  Acute maxillary sinusitis Maxillary sinusitis with middle ear effusion on R likely due to eustachian tube dysfunction.  Start augmentin Increase fluid intake May continue meclizine prn.  Discussed f/u for any worsening symptoms or if symptoms worsen.     Essential hypertension, benign -BP elevated today, may be due to decongestant use.  Recommend avoiding decongestants and f/u with pcp

## 2018-01-21 NOTE — Patient Instructions (Signed)

## 2018-01-21 NOTE — Telephone Encounter (Signed)
Pt. Reports she noticed "some dizziness " last week and it got worse over the weekend. Vertigo when she moves her head and changes position. Has had this in the past. No availability at [facility] office. Appointment at [facility] for today.  Reason for Disposition . [1] MODERATE dizziness (e.g., interferes with normal activities) AND [2] has NOT been evaluated by physician for this  (Exception: dizziness caused by heat exposure, sudden standing, or poor fluid intake)  Answer Assessment - Initial Assessment Questions 1. DESCRIPTION: "Describe your dizziness."     Dizziness 2. LIGHTHEADED: "Do you feel lightheaded?" (e.g., somewhat faint, woozy, weak upon standing)     Dizzy 3. VERTIGO: "Do you feel like either you or the room is spinning or tilting?" (i.e. vertigo)     Yes 4. SEVERITY: "How bad is it?"  "Do you feel like you are going to faint?" "Can you stand and walk?"   - MILD - walking normally   - MODERATE - interferes with normal activities (e.g., work, school)    - SEVERE - unable to stand, requires support to walk, feels like passing out now.      Mild 5. ONSET:  "When did the dizziness begin?"     Last week 6. AGGRAVATING FACTORS: "Does anything make it worse?" (e.g., standing, change in head position)     Change in head position 7. HEART RATE: "Can you tell me your heart rate?" "How many beats in 15 seconds?"  (Note: not all patients can do this)       No 8. CAUSE: "What do you think is causing the dizziness?"     Maybe inner ear 9. RECURRENT SYMPTOM: "Have you had dizziness before?" If so, ask: "When was the last time?" "What happened that time?"     Yes - inner ear 10. OTHER SYMPTOMS: "Do you have any other symptoms?" (e.g., fever, chest pain, vomiting, diarrhea, bleeding)       Nausea 11. PREGNANCY: "Is there any chance you are pregnant?" "When was your last menstrual period?"       yes  Protocols used: DIZZINESS [facility]

## 2018-01-23 ENCOUNTER — Ambulatory Visit (INDEPENDENT_AMBULATORY_CARE_PROVIDER_SITE_OTHER): Payer: BLUE CROSS/BLUE SHIELD | Admitting: Psychology

## 2018-01-23 ENCOUNTER — Other Ambulatory Visit: Payer: Self-pay | Admitting: Family

## 2018-01-23 DIAGNOSIS — F331 Major depressive disorder, recurrent, moderate: Secondary | ICD-10-CM | POA: Diagnosis not present

## 2018-01-28 ENCOUNTER — Encounter: Payer: BLUE CROSS/BLUE SHIELD | Admitting: Family

## 2018-01-28 ENCOUNTER — Other Ambulatory Visit: Payer: Self-pay | Admitting: Internal Medicine

## 2018-01-28 DIAGNOSIS — E559 Vitamin D deficiency, unspecified: Secondary | ICD-10-CM

## 2018-01-28 DIAGNOSIS — Z9884 Bariatric surgery status: Secondary | ICD-10-CM

## 2018-02-02 ENCOUNTER — Other Ambulatory Visit: Payer: Self-pay | Admitting: Internal Medicine

## 2018-02-02 DIAGNOSIS — D508 Other iron deficiency anemias: Secondary | ICD-10-CM

## 2018-02-06 ENCOUNTER — Ambulatory Visit: Payer: Self-pay | Admitting: Psychology

## 2018-02-16 ENCOUNTER — Other Ambulatory Visit: Payer: Self-pay | Admitting: Internal Medicine

## 2018-02-16 DIAGNOSIS — L301 Dyshidrosis [pompholyx]: Secondary | ICD-10-CM

## 2018-02-20 ENCOUNTER — Ambulatory Visit: Payer: Self-pay | Admitting: Psychology

## 2018-03-06 ENCOUNTER — Ambulatory Visit: Payer: BLUE CROSS/BLUE SHIELD | Admitting: Psychology

## 2018-04-01 ENCOUNTER — Encounter: Payer: Self-pay | Admitting: Family

## 2018-04-01 ENCOUNTER — Other Ambulatory Visit: Payer: Self-pay | Admitting: Family

## 2018-04-01 DIAGNOSIS — L301 Dyshidrosis [pompholyx]: Secondary | ICD-10-CM

## 2018-04-01 MED ORDER — DOXEPIN HCL 10 MG PO CAPS: 10.0000 mg | ORAL_CAPSULE | Freq: Every day | ORAL | 1 refills | Status: AC

## 2018-04-22 ENCOUNTER — Other Ambulatory Visit: Payer: Self-pay | Admitting: Internal Medicine

## 2018-04-22 DIAGNOSIS — L301 Dyshidrosis [pompholyx]: Secondary | ICD-10-CM

## 2018-06-01 ENCOUNTER — Other Ambulatory Visit: Payer: Self-pay | Admitting: Internal Medicine

## 2018-06-01 DIAGNOSIS — L301 Dyshidrosis [pompholyx]: Secondary | ICD-10-CM

## 2021-06-20 ENCOUNTER — Other Ambulatory Visit: Payer: Self-pay | Admitting: Obstetrics and Gynecology

## 2021-08-12 ENCOUNTER — Other Ambulatory Visit: Payer: Self-pay

## 2021-08-12 ENCOUNTER — Encounter (HOSPITAL_COMMUNITY): Payer: Self-pay | Admitting: Obstetrics and Gynecology

## 2021-08-12 NOTE — Progress Notes (Signed)
PCP - Sherrie Mustache, MD Cardiologist - denies  Chest x-ray - n/a EKG - day of surgery Stress Test - denies ECHO - denies Cardiac Cath - denies  CPAP - n/a  Fasting Blood Sugar - n/a  Blood Thinner Instructions: n/a Aspirin Instructions - last day 08/10/21 per patient Patient was instructed: As of today, STOP taking any Aspirin (unless otherwise instructed by your surgeon) Aleve, Naproxen, Ibuprofen, Motrin, Advil, Goody's, BC's, all herbal medications, fish oil, and all vitamins.  ERAS Protcol - n/a  COVID TEST- n/a  Anesthesia review: no  Patient verbally denies any shortness of breath, fever, cough and chest pain during phone call   -------------  SDW INSTRUCTIONS given:  Your procedure is scheduled on Monday, August 7th, 2023.  Report to [facility] Main Entrance "A" at 0930 A.M., and check in at the Admitting office.  Call this number if you have problems the morning of surgery:  [phone]   Remember:  Do not eat or drink after midnight the night before your surgery    Take these medicines the morning of surgery with A SIP OF WATER Norvasc, Prilosec   The day of surgery:                     Do not wear jewelry, make up, or nail polish            Do not wear lotions, powders, perfumes, or deodorant.            Do not shave 48 hours prior to surgery.              Do not bring valuables to the hospital.            [facility] is not responsible for any belongings or valuables.  Do NOT Smoke (Tobacco/Vaping) 24 hours prior to your procedure If you use a CPAP at night, you may bring all equipment for your overnight stay.   Contacts, glasses, dentures or bridgework may not be worn into surgery.      For patients admitted to the hospital, discharge time will be determined by your treatment team.   Patients discharged the day of surgery will not be allowed to drive home, and someone needs to stay with them for 24 hours.    Special instructions:   [facility]- Preparing For Surgery  Before surgery, you can play an important role. Because skin is not sterile, your skin needs to be as free of germs as possible. You can reduce the number of germs on your skin by washing with CHG (chlorahexidine gluconate) Soap before surgery.  CHG is an antiseptic cleaner which kills germs and bonds with the skin to continue killing germs even after washing.    Oral Hygiene is also important to reduce your risk of infection.  Remember - BRUSH YOUR TEETH THE MORNING OF SURGERY WITH YOUR REGULAR TOOTHPASTE  Please do not use if you have an allergy to CHG or antibacterial soaps. If your skin becomes reddened/irritated stop using the CHG.  Do not shave (including legs and underarms) for at least 48 hours prior to first CHG shower. It is OK to shave your face.  Please follow these instructions carefully.   Shower the NIGHT BEFORE SURGERY and the MORNING OF SURGERY with DIAL Soap.   Pat yourself dry with a CLEAN TOWEL.  Wear CLEAN PAJAMAS to bed the night before surgery  Place CLEAN SHEETS on your bed the night of your first  shower and DO NOT SLEEP WITH PETS.   Day of Surgery: Please shower morning of surgery  Wear Clean/Comfortable clothing the morning of surgery Do not apply any deodorants/lotions.   Remember to brush your teeth WITH YOUR REGULAR TOOTHPASTE.   Questions were answered. Patient verbalized understanding of instructions.

## 2021-08-15 ENCOUNTER — Other Ambulatory Visit: Payer: Self-pay

## 2021-08-15 ENCOUNTER — Ambulatory Visit (HOSPITAL_COMMUNITY)
Admission: RE | Admit: 2021-08-15 | Discharge: 2021-08-16 | Disposition: A | Discharge status: Home / Self Care | Payer: 59 | Attending: Obstetrics and Gynecology | Admitting: Obstetrics and Gynecology

## 2021-08-15 ENCOUNTER — Ambulatory Visit (HOSPITAL_COMMUNITY): Payer: 59 | Admitting: Anesthesiology

## 2021-08-15 ENCOUNTER — Ambulatory Visit (HOSPITAL_BASED_OUTPATIENT_CLINIC_OR_DEPARTMENT_OTHER): Payer: 59 | Admitting: Anesthesiology

## 2021-08-15 ENCOUNTER — Encounter (HOSPITAL_COMMUNITY): Payer: Self-pay | Admitting: Obstetrics and Gynecology

## 2021-08-15 ENCOUNTER — Encounter (HOSPITAL_COMMUNITY)
Admission: RE | Disposition: A | Discharge status: Home / Self Care | Payer: Self-pay | Source: Home / Self Care | Attending: Obstetrics and Gynecology

## 2021-08-15 DIAGNOSIS — I10 Essential (primary) hypertension: Secondary | ICD-10-CM | POA: Diagnosis not present

## 2021-08-15 DIAGNOSIS — N8189 Other female genital prolapse: Secondary | ICD-10-CM | POA: Diagnosis present

## 2021-08-15 DIAGNOSIS — Z6833 Body mass index (BMI) 33.0-33.9, adult: Secondary | ICD-10-CM | POA: Insufficient documentation

## 2021-08-15 DIAGNOSIS — E669 Obesity, unspecified: Secondary | ICD-10-CM | POA: Diagnosis not present

## 2021-08-15 DIAGNOSIS — K219 Gastro-esophageal reflux disease without esophagitis: Secondary | ICD-10-CM | POA: Diagnosis not present

## 2021-08-15 DIAGNOSIS — N819 Female genital prolapse, unspecified: Secondary | ICD-10-CM | POA: Diagnosis present

## 2021-08-15 DIAGNOSIS — N393 Stress incontinence (female) (male): Secondary | ICD-10-CM

## 2021-08-15 DIAGNOSIS — Z90710 Acquired absence of both cervix and uterus: Secondary | ICD-10-CM | POA: Diagnosis present

## 2021-08-15 HISTORY — PX: IUD REMOVAL: SHX5392

## 2021-08-15 HISTORY — PX: CYSTOSCOPY: SHX5120

## 2021-08-15 HISTORY — PX: ANTERIOR AND POSTERIOR REPAIR: SHX5121

## 2021-08-15 HISTORY — PX: LAPAROSCOPIC VAGINAL HYSTERECTOMY WITH SALPINGECTOMY: SHX6680

## 2021-08-15 HISTORY — PX: BLADDER SUSPENSION: SHX72

## 2021-08-15 LAB — CBC
HCT: 41.9 % (ref 36.0–46.0)
Hemoglobin: 13.4 g/dL (ref 12.0–15.0)
MCH: 27.1 pg (ref 26.0–34.0)
MCHC: 32 g/dL (ref 30.0–36.0)
MCV: 84.6 fL (ref 80.0–100.0)
Platelets: 310 10*3/uL (ref 150–400)
RBC: 4.95 MIL/uL (ref 3.87–5.11)
RDW: 13.4 % (ref 11.5–15.5)
WBC: 5.8 10*3/uL (ref 4.0–10.5)
nRBC: 0 % (ref 0.0–0.2)

## 2021-08-15 LAB — ABO/RH: ABO/RH(D): B NEG

## 2021-08-15 LAB — BASIC METABOLIC PANEL
Anion gap: 6 (ref 5–15)
BUN: 10 mg/dL (ref 6–20)
CO2: 27 mmol/L (ref 22–32)
Calcium: 8.7 mg/dL — ABNORMAL LOW (ref 8.9–10.3)
Chloride: 105 mmol/L (ref 98–111)
Creatinine, Ser: 0.56 mg/dL (ref 0.44–1.00)
GFR, Estimated: >60 mL/min (ref >60)
Glucose, Bld: 82 mg/dL (ref 70–99)
Potassium: 4 mmol/L (ref 3.5–5.1)
Sodium: 138 mmol/L (ref 135–145)

## 2021-08-15 LAB — TYPE AND SCREEN
ABO/RH(D): B NEG
Antibody Screen: NEGATIVE

## 2021-08-15 LAB — POCT PREGNANCY, URINE: Preg Test, Ur: NEGATIVE

## 2021-08-15 SURGERY — HYSTERECTOMY, VAGINAL, LAPAROSCOPY-ASSISTED, WITH SALPINGECTOMY
Anesthesia: General | Site: Vagina

## 2021-08-15 MED ORDER — AMLODIPINE BESYLATE 5 MG PO TABS
2.5000 mg | ORAL_TABLET | Freq: Every day | ORAL | Status: DC
  Administered 2021-08-16: 2.5 mg via ORAL
  Filled 2021-08-15: qty 1

## 2021-08-15 MED ORDER — STERILE WATER FOR IRRIGATION IR SOLN: Status: DC | PRN

## 2021-08-15 MED ORDER — POVIDONE-IODINE 10 % EX SWAB
2.0000 | Freq: Once | CUTANEOUS | Status: AC
  Administered 2021-08-15: 2 via TOPICAL

## 2021-08-15 MED ORDER — KETOROLAC TROMETHAMINE 30 MG/ML IJ SOLN
INTRAMUSCULAR | Status: AC
  Filled 2021-08-15: qty 1

## 2021-08-15 MED ORDER — DIPHENHYDRAMINE HCL 12.5 MG/5ML PO ELIX: 12.5000 mg | ORAL_SOLUTION | Freq: Four times a day (QID) | ORAL | Status: DC | PRN

## 2021-08-15 MED ORDER — LIDOCAINE HCL 2 % IJ SOLN
INTRAMUSCULAR | Status: AC
  Filled 2021-08-15: qty 20

## 2021-08-15 MED ORDER — HYDROMORPHONE HCL 1 MG/ML IJ SOLN: 0.2500 mg | INTRAMUSCULAR | Status: DC | PRN

## 2021-08-15 MED ORDER — VASOPRESSIN 20 UNIT/ML IV SOLN
INTRAVENOUS | Status: AC
Start: 2021-08-15 — End: ?
  Filled 2021-08-15: qty 1

## 2021-08-15 MED ORDER — ONDANSETRON HCL 4 MG/2ML IJ SOLN
INTRAMUSCULAR | Status: DC | PRN
  Administered 2021-08-15: 4 mg via INTRAVENOUS

## 2021-08-15 MED ORDER — KETOROLAC TROMETHAMINE 30 MG/ML IJ SOLN
30.0000 mg | Freq: Once | INTRAMUSCULAR | Status: AC
  Administered 2021-08-15: 30 mg via INTRAVENOUS

## 2021-08-15 MED ORDER — HYDROMORPHONE 1 MG/ML IV SOLN
INTRAVENOUS | Status: DC
  Filled 2021-08-15: qty 30

## 2021-08-15 MED ORDER — MIDAZOLAM HCL 2 MG/2ML IJ SOLN
INTRAMUSCULAR | Status: AC
  Filled 2021-08-15: qty 2

## 2021-08-15 MED ORDER — ONDANSETRON HCL 4 MG/2ML IJ SOLN
4.0000 mg | Freq: Four times a day (QID) | INTRAMUSCULAR | Status: DC | PRN
  Administered 2021-08-15: 4 mg via INTRAVENOUS
  Filled 2021-08-15: qty 2

## 2021-08-15 MED ORDER — METHYLENE BLUE 1 % INJ SOLN
INTRAVENOUS | Status: DC | PRN
  Administered 2021-08-15: 30 mg via INTRAVENOUS

## 2021-08-15 MED ORDER — SODIUM CHLORIDE (PF) 0.9 % IJ SOLN
INTRAMUSCULAR | Status: AC
  Filled 2021-08-15: qty 50

## 2021-08-15 MED ORDER — MEPERIDINE HCL 25 MG/ML IJ SOLN: 6.2500 mg | INTRAMUSCULAR | Status: DC | PRN

## 2021-08-15 MED ORDER — DIPHENHYDRAMINE HCL 50 MG/ML IJ SOLN
12.5000 mg | Freq: Four times a day (QID) | INTRAMUSCULAR | Status: DC | PRN
Start: 2021-08-15 — End: 2021-08-16

## 2021-08-15 MED ORDER — IBUPROFEN 600 MG PO TABS: 600.0000 mg | ORAL_TABLET | Freq: Four times a day (QID) | ORAL | Status: DC

## 2021-08-15 MED ORDER — PROPOFOL 10 MG/ML IV BOLUS
INTRAVENOUS | Status: AC
  Filled 2021-08-15: qty 20

## 2021-08-15 MED ORDER — FENTANYL CITRATE (PF) 250 MCG/5ML IJ SOLN
INTRAMUSCULAR | Status: DC | PRN
  Administered 2021-08-15 (×2): 50 ug via INTRAVENOUS
  Administered 2021-08-15: 150 ug via INTRAVENOUS

## 2021-08-15 MED ORDER — FENTANYL CITRATE (PF) 250 MCG/5ML IJ SOLN
INTRAMUSCULAR | Status: AC
  Filled 2021-08-15: qty 5

## 2021-08-15 MED ORDER — CHLORHEXIDINE GLUCONATE 0.12 % MT SOLN
15.0000 mL | Freq: Once | OROMUCOSAL | Status: AC
  Administered 2021-08-15: 15 mL via OROMUCOSAL
  Filled 2021-08-15: qty 15

## 2021-08-15 MED ORDER — VASOPRESSIN 20 UNIT/ML IV SOLN
INTRAVENOUS | Status: DC | PRN
  Administered 2021-08-15: 45 mL via INTRAMUSCULAR

## 2021-08-15 MED ORDER — SUGAMMADEX SODIUM 200 MG/2ML IV SOLN
INTRAVENOUS | Status: DC | PRN
  Administered 2021-08-15: 200 mg via INTRAVENOUS

## 2021-08-15 MED ORDER — MIDAZOLAM HCL 2 MG/2ML IJ SOLN
INTRAMUSCULAR | Status: DC | PRN
  Administered 2021-08-15: 2 mg via INTRAVENOUS

## 2021-08-15 MED ORDER — CEFAZOLIN IN SODIUM CHLORIDE 3-0.9 GM/100ML-% IV SOLN
3.0000 g | INTRAVENOUS | Status: AC
  Administered 2021-08-15: 2 g via INTRAVENOUS
  Filled 2021-08-15: qty 100

## 2021-08-15 MED ORDER — ACETAMINOPHEN 500 MG PO TABS
1000.0000 mg | ORAL_TABLET | Freq: Once | ORAL | Status: AC
Start: 2021-08-15 — End: 2021-08-15
  Administered 2021-08-15: 1000 mg via ORAL
  Filled 2021-08-15: qty 2

## 2021-08-15 MED ORDER — SODIUM CHLORIDE 0.9 % IR SOLN
Status: DC | PRN
  Administered 2021-08-15: 1000 mL via INTRAVESICAL

## 2021-08-15 MED ORDER — LACTATED RINGERS IV SOLN: INTRAVENOUS | Status: DC

## 2021-08-15 MED ORDER — ESTRADIOL 0.1 MG/GM VA CREA
TOPICAL_CREAM | VAGINAL | Status: AC
  Filled 2021-08-15: qty 42.5

## 2021-08-15 MED ORDER — OXYCODONE HCL 5 MG PO TABS
5.0000 mg | ORAL_TABLET | ORAL | Status: DC | PRN
  Administered 2021-08-16: 5 mg via ORAL
  Filled 2021-08-15: qty 1

## 2021-08-15 MED ORDER — ACETAMINOPHEN 500 MG PO TABS
1000.0000 mg | ORAL_TABLET | Freq: Four times a day (QID) | ORAL | Status: DC
  Administered 2021-08-15 – 2021-08-16 (×3): 1000 mg via ORAL
  Filled 2021-08-15 (×3): qty 2

## 2021-08-15 MED ORDER — KETOROLAC TROMETHAMINE 30 MG/ML IJ SOLN
30.0000 mg | Freq: Four times a day (QID) | INTRAMUSCULAR | Status: DC
  Administered 2021-08-16 (×3): 30 mg via INTRAVENOUS
  Filled 2021-08-15 (×3): qty 1

## 2021-08-15 MED ORDER — PROMETHAZINE HCL 25 MG/ML IJ SOLN: 6.2500 mg | INTRAMUSCULAR | Status: DC | PRN

## 2021-08-15 MED ORDER — ORAL CARE MOUTH RINSE: 15.0000 mL | Freq: Once | OROMUCOSAL | Status: AC

## 2021-08-15 MED ORDER — PANTOPRAZOLE SODIUM 40 MG PO TBEC
40.0000 mg | DELAYED_RELEASE_TABLET | Freq: Every day | ORAL | Status: DC
  Administered 2021-08-16: 40 mg via ORAL
  Filled 2021-08-15: qty 1

## 2021-08-15 MED ORDER — PROPOFOL 10 MG/ML IV BOLUS
INTRAVENOUS | Status: DC | PRN
  Administered 2021-08-15: 130 mg via INTRAVENOUS

## 2021-08-15 MED ORDER — ROCURONIUM BROMIDE 10 MG/ML (PF) SYRINGE
PREFILLED_SYRINGE | INTRAVENOUS | Status: DC | PRN
  Administered 2021-08-15: 60 mg via INTRAVENOUS
  Administered 2021-08-15: 30 mg via INTRAVENOUS
  Administered 2021-08-15: 40 mg via INTRAVENOUS
  Administered 2021-08-15: 30 mg via INTRAVENOUS

## 2021-08-15 MED ORDER — OXYCODONE HCL 5 MG/5ML PO SOLN: 5.0000 mg | Freq: Once | ORAL | Status: DC | PRN

## 2021-08-15 MED ORDER — SODIUM CHLORIDE 0.9 % IR SOLN
Status: DC | PRN
  Administered 2021-08-15: 1000 mL

## 2021-08-15 MED ORDER — SCOPOLAMINE 1 MG/3DAYS TD PT72
1.0000 | MEDICATED_PATCH | TRANSDERMAL | Status: DC
Start: 2021-08-15 — End: 2021-08-15
  Administered 2021-08-15: 1.5 mg via TRANSDERMAL
  Filled 2021-08-15: qty 1

## 2021-08-15 MED ORDER — DEXMEDETOMIDINE (PRECEDEX) IN NS 20 MCG/5ML (4 MCG/ML) IV SYRINGE
PREFILLED_SYRINGE | INTRAVENOUS | Status: DC | PRN
  Administered 2021-08-15 (×2): 8 ug via INTRAVENOUS

## 2021-08-15 MED ORDER — NALOXONE HCL 0.4 MG/ML IJ SOLN: 0.4000 mg | INTRAMUSCULAR | Status: DC | PRN

## 2021-08-15 MED ORDER — OXYCODONE HCL 5 MG PO TABS: 5.0000 mg | ORAL_TABLET | Freq: Once | ORAL | Status: DC | PRN

## 2021-08-15 MED ORDER — LACTATED RINGERS IV SOLN: INTRAVENOUS | Status: DC | PRN

## 2021-08-15 MED ORDER — SODIUM CHLORIDE 0.9% FLUSH: 9.0000 mL | INTRAVENOUS | Status: DC | PRN

## 2021-08-15 MED ORDER — BUPIVACAINE HCL (PF) 0.25 % IJ SOLN
INTRAMUSCULAR | Status: AC
  Filled 2021-08-15: qty 30

## 2021-08-15 MED ORDER — ALBUMIN HUMAN 5 % IV SOLN: INTRAVENOUS | Status: DC | PRN

## 2021-08-15 MED ORDER — MIDAZOLAM HCL 2 MG/2ML IJ SOLN: 0.5000 mg | Freq: Once | INTRAMUSCULAR | Status: DC | PRN

## 2021-08-15 MED ORDER — DEXAMETHASONE SODIUM PHOSPHATE 10 MG/ML IJ SOLN
INTRAMUSCULAR | Status: DC | PRN
  Administered 2021-08-15: 10 mg via INTRAVENOUS

## 2021-08-15 MED ORDER — SIMETHICONE 80 MG PO CHEW: 80.0000 mg | CHEWABLE_TABLET | Freq: Four times a day (QID) | ORAL | Status: DC | PRN

## 2021-08-15 MED ORDER — LIDOCAINE 2% (20 MG/ML) 5 ML SYRINGE
INTRAMUSCULAR | Status: DC | PRN
  Administered 2021-08-15: 40 mg via INTRAVENOUS

## 2021-08-15 MED ORDER — ESTRADIOL 0.1 MG/GM VA CREA
TOPICAL_CREAM | VAGINAL | Status: DC | PRN
  Administered 2021-08-15: 1 via VAGINAL

## 2021-08-15 SURGICAL SUPPLY — 52 items
BAG COUNTER SPONGE SURGICOUNT (BAG) ×5 IMPLANT
BLADE SURG 11 STRL SS (BLADE) ×4 IMPLANT
BLADE SURG 15 STRL LF DISP TIS (BLADE) ×3 IMPLANT
BLADE SURG 15 STRL SS (BLADE) ×4
CANISTER SUCT 3000ML PPV (MISCELLANEOUS) ×4 IMPLANT
CATH FOLEY 2WAY SLVR  5CC 18FR (CATHETERS) ×4
CATH FOLEY 2WAY SLVR 5CC 18FR (CATHETERS) ×3 IMPLANT
CNTNR URN SCR LID CUP LEK RST (MISCELLANEOUS) IMPLANT
CONT SPEC 4OZ STRL OR WHT (MISCELLANEOUS) ×4
DERMABOND ADVANCED (GAUZE/BANDAGES/DRESSINGS) ×1
DERMABOND ADVANCED .7 DNX12 (GAUZE/BANDAGES/DRESSINGS) IMPLANT
ELECT REM PT RETURN 9FT ADLT (ELECTROSURGICAL) ×4
ELECTRODE REM PT RTRN 9FT ADLT (ELECTROSURGICAL) ×3 IMPLANT
GAUZE 4X4 16PLY ~~LOC~~+RFID DBL (SPONGE) ×5 IMPLANT
GAUZE PACKING 2X5 YD STRL (GAUZE/BANDAGES/DRESSINGS) ×1 IMPLANT
GAUZE SPONGE 4X4 16PLY XRAY LF (GAUZE/BANDAGES/DRESSINGS) ×1 IMPLANT
GLOVE BIO SURGEON STRL SZ7.5 (GLOVE) ×11 IMPLANT
GLOVE BIOGEL PI IND STRL 6.5 (GLOVE) ×3 IMPLANT
GLOVE BIOGEL PI IND STRL 7.0 (GLOVE) ×6 IMPLANT
GLOVE BIOGEL PI IND STRL 7.5 (GLOVE) ×9 IMPLANT
GLOVE BIOGEL PI INDICATOR 6.5 (GLOVE) ×1
GLOVE BIOGEL PI INDICATOR 7.0 (GLOVE) ×2
GLOVE BIOGEL PI INDICATOR 7.5 (GLOVE) ×4
GLOVE SURG SS PI 7.0 STRL IVOR (GLOVE) ×1 IMPLANT
GLOVE SURG UNDER POLY LF SZ7 (GLOVE) ×10 IMPLANT
GOWN STRL REUS W/ TWL LRG LVL3 (GOWN DISPOSABLE) ×12 IMPLANT
GOWN STRL REUS W/TWL LRG LVL3 (GOWN DISPOSABLE) ×20
KIT TURNOVER KIT B (KITS) ×4 IMPLANT
NEEDLE HYPO 22GX1.5 SAFETY (NEEDLE) ×4 IMPLANT
PACK VAGINAL WOMENS (CUSTOM PROCEDURE TRAY) ×4 IMPLANT
SET CYSTO W/LG BORE CLAMP LF (SET/KITS/TRAYS/PACK) ×4 IMPLANT
SET TUBE SMOKE EVAC HIGH FLOW (TUBING) ×4 IMPLANT
SLING TRANS VAGINAL TAPE (Sling) ×1 IMPLANT
SLING UTERINE/ABD GYNECARE TVT (Sling) IMPLANT
SPIKE FLUID TRANSFER (MISCELLANEOUS) ×3 IMPLANT
SUT VIC AB 0 CT1 18XCR BRD8 (SUTURE) ×6 IMPLANT
SUT VIC AB 0 CT1 27 (SUTURE) ×52
SUT VIC AB 0 CT1 27XBRD ANBCTR (SUTURE) ×3 IMPLANT
SUT VIC AB 0 CT1 36 (SUTURE) ×4 IMPLANT
SUT VIC AB 0 CT1 8-18 (SUTURE) ×8
SUT VIC AB 2-0 CT1 27 (SUTURE) ×24
SUT VIC AB 2-0 CT1 TAPERPNT 27 (SUTURE) IMPLANT
SUT VIC AB 3-0 SH 27 (SUTURE) ×20
SUT VIC AB 3-0 SH 27X BRD (SUTURE) IMPLANT
SUT VICRYL 0 TIES 12 18 (SUTURE) ×4 IMPLANT
SYR 3ML LL SCALE MARK (SYRINGE) ×1 IMPLANT
SYR BULB IRRIG 60ML STRL (SYRINGE) ×2 IMPLANT
TOWEL GREEN STERILE FF (TOWEL DISPOSABLE) ×8 IMPLANT
TRAY FOLEY W/BAG SLVR 14FR (SET/KITS/TRAYS/PACK) ×4 IMPLANT
TRAY FOLEY W/BAG SLVR 16FR (SET/KITS/TRAYS/PACK) ×4
TRAY FOLEY W/BAG SLVR 16FR ST (SET/KITS/TRAYS/PACK) ×3 IMPLANT
UNDERPAD 30X36 HEAVY ABSORB (UNDERPADS AND DIAPERS) ×4 IMPLANT

## 2021-08-15 NOTE — Transfer of Care (Signed)
Immediate Anesthesia Transfer of Care Note  Patient: Sritha A Stokke  Procedure(s) Performed: TOTAL HYSTERECTOMY VAGINAL WITH BILATERAL SALPINGECTOMY and bilateral oopherectomy (Bilateral: Vagina ) ANTERIOR (CYSTOCELE) AND POSTERIOR REPAIR (RECTOCELE) (Vagina ) TRANSVAGINAL TAPE (TVT) PROCEDURE (Vagina ) CYSTOSCOPY (Bladder) INTRAUTERINE DEVICE (IUD) REMOVAL (Vagina )  Patient Location: PACU  Anesthesia Type:General  Level of Consciousness: awake, alert  and oriented  Airway & Oxygen Therapy: Patient Spontanous Breathing  Post-op Assessment: Report given to RN and Post -op Vital signs reviewed and stable  Post vital signs: Reviewed and stable  Last Vitals:  Vitals Value Taken Time  BP 127/84 08/15/21 1844  Temp    Pulse 90 08/15/21 1845  Resp 22 08/15/21 1845  SpO2 95 % 08/15/21 1845  Vitals shown include unvalidated device data.  Last Pain:  Vitals:   08/15/21 0944  TempSrc:   PainSc: 0-No pain         Complications: No notable events documented.

## 2021-08-15 NOTE — Op Note (Addendum)
Preop Diagnosis: 1.PELVIC RELAXATION 2.SUI 3.IUD IN SITU  Postop Diagnosis: 1.PELVIC RELAXATION 2.SUI 3.IUD IN SITU  Procedure: TOTAL VAGINAL HYSTERECTOMY WITH BILATERAL SALPINGO-OOPHORECTOMY ANTERIOR (CYSTOCELE) AND POSTERIOR REPAIR (RECTOCELE) TENSION FREE VAGINAL TAPE (TVT) PROCEDURE CYSTOSCOPY INTRAUTERINE DEVICE (IUD) REMOVAL   Anesthesia: Choice   Anesthesiologist: Dr. Jairo Ben  Attending: Osborn Coho, MD   Assistant: Jaymes Graff, MD  Findings: Small uterus, cystocele, rectocele, nl appearing bilateral ovaries and tubes  Pathology: 1.Uterus 2.Cervix 3.Bilateral ovaries and tubes  Fluids: 1.1500 cc 2.Albumin 250 cc  UOP: 300 cc  EBL: 400 cc  Complications: None  Procedure: The patient was taken to the operating room after the risks, benefits and alternatives were discussed with the patient. The patient verbalized understanding and consent signed and witnessed. The patient was placed under general anesthesia per the anesthesiologist and a timeout was performed per protocol. The patient was prepped and draped in the normal sterile fashion in the dorsal lithotomy position.  A weighted speculum was placed the patient's vagina and the anterior lip of the cervix was grasped with a single-tooth tenaculum and Dever retractors were placed for vaginal wall retraction. The cervix was circumscribed with the bovie after injecting the cervix with pitressin at a concentration of 20 units of Pitressin in 100 cc of normal saline.  Once the cervix was circumscribed the anterior cul-de-sac was entered without difficulty.  The posterior cul-de-sac was entered without difficulty as well.  Curved Heaney clamps were used to clamp the uterosacral and cardinal ligaments and the tissue was then cut and suture ligated using 0 Vicryl. This was done bilaterally and sequentially. The remaining parametrial tissue was clamped, cut and suture ligated using 0 Vicryl in a sequential and  bilateral fashion as well.  The uterine fundus was exteriorized and the remaining pedicles were bilaterally clamped, cut and suture ligated with 0 vicryl.  The uterus and cervix were handed off to be sent to pathology. The bilateral ovaries and fallopian tubes appeared to be within normal limits. The angles of the cuff were sutured using 0 vicryl.  The cuff was then repaired to the midline with figure-of-eight and interrupted stitches of 0 Vicryl.  The cuff was noted to be hemostatic.    The cystocele was identified and the anterior vaginal wall was injected with dilute Pitressin.  The anterior vaginal wall was then incised and the underlying tissue dissected away.  Excess tissue was excised and the anterior vaginal wall was repaired with a purse string stitch of 3-0 vicryl.  The anterior vaginal wall was then repaired using 2-0 Vicryl via interrupted stitches. The Foley was removed and cystoscopy was performed.  Bilateral ureters were noted to efflux without difficulty and there were no inadvertent bladder injuries. The Foley was replaced to gravity.    A weighted speculum was placed in the patient's vagina and the anterior vaginal wall was injected with dilute pitressin at a concentration of 20 units of pitressin in a total of 100cc of normal saline.  An incision was made in the anterior wall of the vagina for approximately 1cm beneath the midurethra and the underlying tissue was dissected away from the anterior vaginal wall down to the level of the lower symphysis pubis bilaterally. Attention was then turned to the mons pubis where two 5 mm incisions were made 2 fingerbreadths from the midline. The transabdominal guide was then passed through the mons pubis incision on the patient's right down through the space of Retzius and out through the anterior vaginal wall after  deflecting the rigid urethral catheter guide to the ipsilateral side. The same was done on the contralateral side. Cystoscopy was performed  and no invadvertant bladder injury was noted. The bladder was drained with a Foley while deflecting the rigid urethral catheter guide to the patient's right and the mesh was attached to the transabdominal guide and elevated up through the space of Retzius and out through the incision on the mons pubis on the ipsilateral side. The same was done on the contralateral side. Cystoscopy was performed again and no inadvertant bladder injury was noted. The 56 French Foley was left in the urethra and a large Tresa Endo was placed between the urethra and the mesh in order to leave the mesh slack beneath the midurethra. The mesh was then cut flush with the skin at the mons pubis incisions bilaterally.  Methylene blue was administered and cystoscopy was performed again and bilateral ureters were noted to efflux without difficulty. The bilateral incisions on the mons pubis were then cleaned and dermabond applied. The anterior vaginal wall incision was repaired with 2-0 vicryl with a running stitch.  Attention was then turned to the posterior vaginal wall where dilute Pitressin was injected. The posterior vaginal wall was incised and the underlying tissue was dissected away from the posterior vaginal wall. The rectocele was repaired using plication stitches of 2-0 Vicryl.  Excess tissue was excised and the posterior vaginal wall was repaired with 2-0 Vicryl via a running interlocking stitch after reinforcing the perineal body.  The perineum was repaired with 3-0 Vicryl via subcutaneous stitch.  Vagina was packed with 2 inch estrogen soaked vaginal cream.  The patient tolerated the procedure well and was returned to the recovery room in good condition.   I was present and scrubbed and the assistant was required due to complexity of anatomy.

## 2021-08-15 NOTE — Anesthesia Procedure Notes (Signed)
Procedure Name: Intubation Date/Time: 08/15/2021 2:33 PM  Performed by: Griffin Dakin, CRNAPre-anesthesia Checklist: Patient identified, Emergency Drugs available, Suction available and Patient being monitored Patient Re-evaluated:Patient Re-evaluated prior to induction Oxygen Delivery Method: Circle system utilized Preoxygenation: Pre-oxygenation with 100% oxygen Induction Type: IV induction Ventilation: Mask ventilation without difficulty Laryngoscope Size: Mac and 4 Grade View: Grade II Tube type: Oral Tube size: 7.0 mm Number of attempts: 1 Airway Equipment and Method: Stylet Placement Confirmation: ETT inserted through vocal cords under direct vision, positive ETCO2 and breath sounds checked- equal and bilateral Secured at: 23 cm Tube secured with: Tape Dental Injury: Teeth and Oropharynx as per pre-operative assessment

## 2021-08-15 NOTE — H&P (Addendum)
Valerie Sweeney is an 56 y.o. female. Pt well known to me desiring and presenting for hysterectomy/BSO/A-P Repair/TVT and Cystoscopy.  Pertinent Gynecological History: H/o regular cycles until IUD was placed.  Denies h/o abnormal pap.  Denies h/o abnormal mammo.   Menstrual History:  No LMP recorded. (Menstrual status: IUD).    Past Medical History:  Diagnosis Date   Allergy    SEASONAL ALLERGIES   Anemia    TAKES IRON AND STATES ALWAYS BEEN ANEMIC   Esophageal dysmotilities    GERD (gastroesophageal reflux disease)    Hiatal hernia    Hypertension    Knee pain    MORE DIFFICULT TO GET UP AND DOWN--RELATES TO HER WEIGHT   Vitamin D deficiency     Past Surgical History:  Procedure Laterality Date   BREATH TEK H PYLORI  01/24/2011   Procedure: BREATH TEK H PYLORI;  Surgeon: Kandis Cocking, MD;  Location: [facility];  Service: General;  Laterality: N/A;   CHOLECYSTECTOMY     LAPAROSCOPIC GASTRECTOMY     sleeve  RUQ incisional hernia repair (from cholecystectomy)  Family History  Problem Relation Age of Onset   Hypertension Mother    Stroke Father    Hypertension Father    Diabetes Father    Cancer Neg Hx    Colon cancer Neg Hx     Social History:  reports that she has never smoked. She has never used smokeless tobacco. She reports that she does not drink alcohol and does not use drugs.  Allergies:  Allergies  Allergen Reactions   Pork-Derived Products Other (See Comments)    Religion     Medications Prior to Admission  Medication Sig Dispense Refill Last Dose   amLODipine (NORVASC) 2.5 MG tablet Take 2.5 mg by mouth daily.   08/15/2021 at 0700   aspirin EC 81 MG tablet Take 81 mg by mouth every evening. Swallow whole.   Past Week   cetirizine (ZYRTEC) 10 MG tablet TAKE 1 TABLET BY MOUTH EVERY DAY (Patient taking differently: Take 10 mg by mouth every evening.) 90 tablet 3 08/14/2021   doxepin (SINEQUAN) 10 MG capsule Take 1 capsule (10 mg total) by mouth at  bedtime. 90 capsule 1 08/14/2021   famotidine (PEPCID) 20 MG tablet Take 20 mg by mouth every evening.   08/14/2021   omeprazole (PRILOSEC) 40 MG capsule Take 40 mg by mouth daily.   08/15/2021 at 0700   Probiotic Product (PROBIOTIC DAILY PO) Take 1 capsule by mouth daily.   Past Week   levonorgestrel (MIRENA, 52 MG,) 20 MCG/24HR IUD Mirena 20 mcg/24 hr (5 years) intrauterine device       Review of Systems Denies F/C/N/V/D  Blood pressure 137/72, pulse 66, temperature 98 F (36.7 C), temperature source Oral, resp. rate 17, height 5\' 6"  (1.676 m), weight 95.3 kg, SpO2 100 %. Physical Exam Lungs CTA CV RRR Abdomen soft, NT Ext no calf tenderness  Results for orders placed or performed during the hospital encounter of 08/15/21 (from the past 24 hour(s))  Type and screen     Status: None   Collection Time: 08/15/21  9:40 AM  Result Value Ref Range   ABO/RH(D) B NEG    Antibody Screen NEG    Sample Expiration      08/18/2021,2359 Performed at [facility], [address] [place], [place]   ABO/Rh     Status: None   Collection Time: 08/15/21  9:50 AM  Result Value  Ref Range   ABO/RH(D)      B NEG Performed at [facility], [address] [place], [place] [address]   CBC     Status: None   Collection Time: 08/15/21  9:58 AM  Result Value Ref Range   WBC 5.8 4.0 - 10.5 K/uL   RBC 4.95 3.87 - 5.11 MIL/uL   Hemoglobin 13.4 12.0 - 15.0 g/dL   HCT 54.0 08.6 - 76.1 %   MCV 84.6 80.0 - 100.0 fL   MCH 27.1 26.0 - 34.0 pg   MCHC 32.0 30.0 - 36.0 g/dL   RDW 95.0 93.2 - 67.1 %   Platelets 310 150 - 400 K/uL   nRBC 0.0 0.0 - 0.2 %  Pregnancy, urine POC     Status: None   Collection Time: 08/15/21  9:59 AM  Result Value Ref Range   Preg Test, Ur NEGATIVE NEGATIVE  Basic metabolic panel     Status: Abnormal   Collection Time: 08/15/21 11:15 AM  Result Value Ref Range   Sodium 138 135 - 145 mmol/L   Potassium 4.0 3.5 - 5.1 mmol/L   Chloride 105 98 - 111 mmol/L    CO2 27 22 - 32 mmol/L   Glucose, Bld 82 70 - 99 mg/dL   BUN 10 6 - 20 mg/dL   Creatinine, Ser 2.45 0.44 - 1.00 mg/dL   Calcium 8.7 (L) 8.9 - 10.3 mg/dL   GFR, Estimated >80 >99 mL/min   Anion gap 6 5 - 15    No results found.  Assessment/Plan: G2 P0303 presenting for TVH/BSO/IUD Removal/A-P Repair/TVT and Cystoscopy d/t pelvic relaxation.  Risks benefits alternatives discussed with the patient including but not limited to bleeding infection injury risk of urinary retention erosion of mesh and need to remove mesh.  Pt verbalized understanding and consent signed and witnessed.  [name] 08/15/2021, 1:09 PM

## 2021-08-15 NOTE — Anesthesia Preprocedure Evaluation (Addendum)
Anesthesia Evaluation  Patient identified by MRN, date of birth, ID band Patient awake    Reviewed: Allergy & Precautions, NPO status , Patient's Chart, lab work & pertinent test results  History of Anesthesia Complications Negative for: history of anesthetic complications  Airway Mallampati: I  TM Distance: >3 FB Neck ROM: Full    Dental  (+) Dental Advisory Given   Pulmonary neg pulmonary ROS,    breath sounds clear to auscultation       Cardiovascular hypertension, Pt. on medications (-) angina Rhythm:Regular Rate:Normal     Neuro/Psych negative neurological ROS     GI/Hepatic Neg liver ROS, GERD  Medicated and Controlled,  Endo/Other  obese  Renal/GU negative Renal ROS     Musculoskeletal  (+) Arthritis ,   Abdominal (+) + obese,   Peds  Hematology   Anesthesia Other Findings   Reproductive/Obstetrics                            Anesthesia Physical Anesthesia Plan  ASA: 2  Anesthesia Plan: General   Post-op Pain Management: Tylenol PO (pre-op)*   Induction: Intravenous  PONV Risk Score and Plan: 3 and Ondansetron, Dexamethasone and Scopolamine patch - Pre-op  Airway Management Planned: Oral ETT  Additional Equipment: None  Intra-op Plan:   Post-operative Plan: Extubation in OR  Informed Consent: I have reviewed the patients History and Physical, chart, labs and discussed the procedure including the risks, benefits and alternatives for the proposed anesthesia with the patient or authorized representative who has indicated his/her understanding and acceptance.     Dental advisory given  Plan Discussed with: CRNA and Surgeon  Anesthesia Plan Comments:        Anesthesia Quick Evaluation

## 2021-08-16 ENCOUNTER — Encounter (HOSPITAL_COMMUNITY): Payer: Self-pay | Admitting: Obstetrics and Gynecology

## 2021-08-16 DIAGNOSIS — N8189 Other female genital prolapse: Secondary | ICD-10-CM | POA: Diagnosis not present

## 2021-08-16 LAB — BASIC METABOLIC PANEL
Anion gap: 9 (ref 5–15)
BUN: 13 mg/dL (ref 6–20)
CO2: 24 mmol/L (ref 22–32)
Calcium: 8.3 mg/dL — ABNORMAL LOW (ref 8.9–10.3)
Chloride: 103 mmol/L (ref 98–111)
Creatinine, Ser: 0.74 mg/dL (ref 0.44–1.00)
GFR, Estimated: >60 mL/min (ref >60)
Glucose, Bld: 119 mg/dL — ABNORMAL HIGH (ref 70–99)
Potassium: 4.4 mmol/L (ref 3.5–5.1)
Sodium: 136 mmol/L (ref 135–145)

## 2021-08-16 LAB — CBC
HCT: 35.4 % — ABNORMAL LOW (ref 36.0–46.0)
Hemoglobin: 11.6 g/dL — ABNORMAL LOW (ref 12.0–15.0)
MCH: 27 pg (ref 26.0–34.0)
MCHC: 32.8 g/dL (ref 30.0–36.0)
MCV: 82.3 fL (ref 80.0–100.0)
Platelets: 317 10*3/uL (ref 150–400)
RBC: 4.3 MIL/uL (ref 3.87–5.11)
RDW: 13.2 % (ref 11.5–15.5)
WBC: 10 10*3/uL (ref 4.0–10.5)
nRBC: 0 % (ref 0.0–0.2)

## 2021-08-16 MED ORDER — DOCUSATE SODIUM 100 MG PO CAPS: 100.0000 mg | ORAL_CAPSULE | Freq: Two times a day (BID) | ORAL | 0 refills | Status: AC

## 2021-08-16 MED ORDER — IBUPROFEN 600 MG PO TABS: 600.0000 mg | ORAL_TABLET | Freq: Four times a day (QID) | ORAL | 1 refills | Status: AC | PRN

## 2021-08-16 MED ORDER — OXYCODONE HCL 5 MG PO TABS
5.0000 mg | ORAL_TABLET | ORAL | 0 refills | Status: AC | PRN
Start: 2021-08-16 — End: ?

## 2021-08-16 MED ORDER — SODIUM CHLORIDE 0.9 % IV SOLN
3.0000 g | Freq: Four times a day (QID) | INTRAVENOUS | Status: DC
  Administered 2021-08-16 (×2): 3 g via INTRAVENOUS
  Filled 2021-08-16 (×2): qty 8

## 2021-08-16 MED ORDER — AMOXICILLIN-POT CLAVULANATE 875-125 MG PO TABS
1.0000 | ORAL_TABLET | Freq: Two times a day (BID) | ORAL | 0 refills | Status: AC
Start: 2021-08-16 — End: 2021-08-23

## 2021-08-16 NOTE — Progress Notes (Addendum)
1 Day Post-Op Procedure(s) (LRB): TOTAL HYSTERECTOMY VAGINAL WITH BILATERAL SALPINGECTOMY and bilateral oopherectomy (Bilateral) ANTERIOR (CYSTOCELE) AND POSTERIOR REPAIR (RECTOCELE) (N/A) TRANSVAGINAL TAPE (TVT) PROCEDURE (N/A) CYSTOSCOPY (N/A) INTRAUTERINE DEVICE (IUD) REMOVAL (N/A)  Subjective: Patient reports no problems voiding.  Tolerating regular food and ambulating without difficulty.  Denies any N/V.  Pt says she had been to the beach and she usually get an infection when she goes to the beach and she thinks her temp could have come from that.  She states she feels good and feels like she is emptying completely.  Objective: I have reviewed patient's vital signs and intake and output.  General: alert and no distress Resp: clear to auscultation bilaterally Cardio: regular rate and rhythm GI: soft, slightly decreased, ND, minimal tenderness Extremities: no calf tenderness Vaginal Bleeding: minimal Incisions dressed with dermabond, c/d/i  Assessment: s/p Procedure(s): TOTAL HYSTERECTOMY VAGINAL WITH BILATERAL SALPINGECTOMY and bilateral oopherectomy (Bilateral) ANTERIOR (CYSTOCELE) AND POSTERIOR REPAIR (RECTOCELE) (N/A) TRANSVAGINAL TAPE (TVT) PROCEDURE (N/A) CYSTOSCOPY (N/A) INTRAUTERINE DEVICE (IUD) REMOVAL (N/A): progressing well and post-op fever  Plan: Check PVR if UOP < 30cc/hr even though pt feels she is emptying completely since only voided spontaneously 125cc/6hrs Tolerating regular diet without N/V but pt instructed to go slow and probably mostly do clears until passing gas normally and has had a BM Pain adequately controlled Unsure of fever etiology but she has not had one since the one she had overnight and additional doses of unasyn were given Pt will go home on augmentin for prophylaxis Ambulating well without any light headedness or dizziness  LOS: 0 days    [name], MD 08/16/2021, 11:37 AM  Pt voided 100cc/3hrs and PVR 20 cc.

## 2021-08-16 NOTE — Progress Notes (Signed)
Pt discharged after discharge instructions given. Pt verbalized understanding and all questions answered. IV discontinued. Pt sent home with all belongings.

## 2021-08-16 NOTE — Discharge Summary (Signed)
Physician Discharge Summary  Patient ID: Valerie Sweeney MRN: [id number] DOB/AGE: Apr 16, 1965 56 y.o.  Admit date: 08/15/2021 Discharge date: 08/16/2021  Admission Diagnoses: Pelvic Relaxation Urinary Incontinence  Discharge Diagnoses:  Principal Problem:   Female genital prolapse Active Problems:   S/P vaginal hysterectomy   Discharged Condition: good  Hospital Course: Doing well on post op day 1.  Pt instructed she should discuss doing sleep study with PCP to evaluate for sleep apnea d/t decreased 02 sats overnight.  On RA pt's sat is 97%.  Pt desires discharge and instructions have been reviewed.  PVR 20cc.  Consults: None  Significant Diagnostic Studies: CBC wnl and CMP wnl  Treatments: surgery: TVH/BSO/IUD removal/A-P Repair/TVT and Cystoscopy  Discharge Exam: Blood pressure 126/63, pulse 80, temperature 98.3 F (36.8 C), temperature source Oral, resp. rate 17, height 5\' 6"  (1.676 m), weight 95.3 kg, SpO2 97 %. General appearance: alert and no distress Resp: clear to auscultation bilaterally Cardio: regular rate and rhythm GI: soft, slightly decreased BSs, minimal tenderness, ND Extremities: no calf tenderness Incision/Wound:dermabond in place  Disposition: Discharge disposition: 01-Home or Self Care        Allergies as of 08/16/2021       Reactions   Pork-derived Products Other (See Comments)   Religion        Medication List     STOP taking these medications    Mirena (52 MG) 20 MCG/24HR IUD Generic drug: levonorgestrel       TAKE these medications    amLODipine 2.5 MG tablet Commonly known as: NORVASC Take 2.5 mg by mouth daily.   amoxicillin-clavulanate 875-125 MG tablet Commonly known as: AUGMENTIN Take 1 tablet by mouth 2 (two) times daily for 7 days.   aspirin EC 81 MG tablet Take 81 mg by mouth every evening. Swallow whole.   cetirizine 10 MG tablet Commonly known as: ZYRTEC TAKE 1 TABLET BY MOUTH EVERY DAY What changed: when to  take this   docusate sodium 100 MG capsule Commonly known as: Colace Take 1 capsule (100 mg total) by mouth 2 (two) times daily. May reduce to 1 capsule daily for a total of 6wks   doxepin 10 MG capsule Commonly known as: SINEQUAN Take 1 capsule (10 mg total) by mouth at bedtime.   famotidine 20 MG tablet Commonly known as: PEPCID Take 20 mg by mouth every evening.   ibuprofen 600 MG tablet Commonly known as: ADVIL Take 1 tablet (600 mg total) by mouth every 6 (six) hours as needed.   omeprazole 40 MG capsule Commonly known as: PRILOSEC Take 40 mg by mouth daily.   oxyCODONE 5 MG immediate release tablet Commonly known as: Oxy IR/ROXICODONE Take 1 tablet (5 mg total) by mouth every 4 (four) hours as needed for moderate pain. May take 2 tablets every 6 hours as needed   PROBIOTIC DAILY PO Take 1 capsule by mouth daily.         Signed: 10/16/2021 08/16/2021, 12:19 PM

## 2021-08-16 NOTE — Anesthesia Postprocedure Evaluation (Signed)
Anesthesia Post Note  Patient: Valerie Sweeney  Procedure(s) Performed: TOTAL HYSTERECTOMY VAGINAL WITH BILATERAL SALPINGECTOMY and bilateral oopherectomy (Bilateral: Vagina ) ANTERIOR (CYSTOCELE) AND POSTERIOR REPAIR (RECTOCELE) (Vagina ) TRANSVAGINAL TAPE (TVT) PROCEDURE (Vagina ) CYSTOSCOPY (Bladder) INTRAUTERINE DEVICE (IUD) REMOVAL (Vagina )     Patient location during evaluation: PACU Anesthesia Type: General Level of consciousness: awake Pain management: pain level controlled Vital Signs Assessment: post-procedure vital signs reviewed and stable Respiratory status: spontaneous breathing Cardiovascular status: stable Postop Assessment: no apparent nausea or vomiting Anesthetic complications: no   No notable events documented.  Last Vitals:  Vitals:   08/16/21 0150 08/16/21 0200  BP:    Pulse:    Resp:    Temp:    SpO2: 94% 94%    Last Pain:  Vitals:   08/16/21 0138  TempSrc:   PainSc: 0-No pain                 Justun Anaya

## 2021-08-17 LAB — SURGICAL PATHOLOGY
